# Patient Record
Sex: Male | Born: 1960 | Race: White | Hispanic: No | Marital: Single | Smoking: Current every day smoker
Health system: Southern US, Community
[De-identification: ages and names within clinical notes are randomized; demographics above are authoritative.]

## PROBLEM LIST (undated history)

## (undated) DIAGNOSIS — Z720 Tobacco use: Secondary | ICD-10-CM

## (undated) DIAGNOSIS — F1010 Alcohol abuse, uncomplicated: Secondary | ICD-10-CM

## (undated) DIAGNOSIS — I10 Essential (primary) hypertension: Secondary | ICD-10-CM

## (undated) HISTORY — DX: Essential (primary) hypertension: I10

## (undated) HISTORY — PX: NECK SURGERY: SHX720

## (undated) HISTORY — DX: Tobacco use: Z72.0

## (undated) HISTORY — DX: Alcohol abuse, uncomplicated: F10.10

---

## 1998-06-03 ENCOUNTER — Encounter: Admission: RE | Admit: 1998-06-03 | Discharge: 1998-07-18 | Payer: Self-pay | Admitting: Family Medicine

## 2002-06-14 ENCOUNTER — Encounter: Payer: Self-pay | Admitting: Neurosurgery

## 2002-06-15 ENCOUNTER — Encounter: Payer: Self-pay | Admitting: Neurosurgery

## 2002-06-15 ENCOUNTER — Inpatient Hospital Stay (HOSPITAL_COMMUNITY): Admission: RE | Admit: 2002-06-15 | Discharge: 2002-06-16 | Payer: Self-pay | Admitting: Neurosurgery

## 2003-02-21 ENCOUNTER — Other Ambulatory Visit: Payer: Self-pay

## 2006-05-08 ENCOUNTER — Other Ambulatory Visit: Payer: Self-pay

## 2006-05-09 ENCOUNTER — Inpatient Hospital Stay: Payer: Self-pay | Admitting: Internal Medicine

## 2009-04-09 ENCOUNTER — Emergency Department: Payer: Self-pay | Admitting: Emergency Medicine

## 2010-08-11 ENCOUNTER — Inpatient Hospital Stay: Payer: Self-pay | Admitting: Internal Medicine

## 2010-08-12 DIAGNOSIS — I517 Cardiomegaly: Secondary | ICD-10-CM

## 2010-08-13 DIAGNOSIS — I509 Heart failure, unspecified: Secondary | ICD-10-CM

## 2010-08-13 DIAGNOSIS — I428 Other cardiomyopathies: Secondary | ICD-10-CM

## 2010-08-14 DIAGNOSIS — J984 Other disorders of lung: Secondary | ICD-10-CM

## 2010-09-09 ENCOUNTER — Encounter: Payer: Self-pay | Admitting: *Deleted

## 2010-09-09 ENCOUNTER — Encounter: Payer: Self-pay | Admitting: Cardiovascular Disease

## 2010-09-09 ENCOUNTER — Ambulatory Visit (INDEPENDENT_AMBULATORY_CARE_PROVIDER_SITE_OTHER): Payer: Self-pay | Admitting: Cardiovascular Disease

## 2010-09-09 DIAGNOSIS — I10 Essential (primary) hypertension: Secondary | ICD-10-CM

## 2010-09-09 DIAGNOSIS — I429 Cardiomyopathy, unspecified: Secondary | ICD-10-CM | POA: Insufficient documentation

## 2010-09-09 DIAGNOSIS — I509 Heart failure, unspecified: Secondary | ICD-10-CM | POA: Insufficient documentation

## 2010-09-09 DIAGNOSIS — I428 Other cardiomyopathies: Secondary | ICD-10-CM

## 2010-09-09 DIAGNOSIS — F1010 Alcohol abuse, uncomplicated: Secondary | ICD-10-CM

## 2010-09-09 MED ORDER — LISINOPRIL 40 MG PO TABS: 40.0000 mg | ORAL_TABLET | Freq: Every day | ORAL | Status: DC

## 2010-09-09 MED ORDER — CARVEDILOL 25 MG PO TABS: 25.0000 mg | ORAL_TABLET | Freq: Two times a day (BID) | ORAL | Status: DC

## 2010-09-09 MED ORDER — IPRATROPIUM-ALBUTEROL 18-103 MCG/ACT IN AERO: 2.0000 | INHALATION_SPRAY | Freq: Four times a day (QID) | RESPIRATORY_TRACT | Status: AC | PRN

## 2010-09-09 NOTE — Assessment & Plan Note (Signed)
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 We will continue to Advance his cardiac medications. Will increase his lisinopril to 40 mg daily, increase his coreg.

## 2010-09-09 NOTE — Assessment & Plan Note (Signed)
Blood pressure is poorly controlled. We have made to medication changes as above. Last him to monitor his blood pressure at home.

## 2010-09-09 NOTE — Patient Instructions (Signed)
You are doing well. Please increase your lisinopril to 40 mg daily. Please increase the coreg to 25 mg twice a day. Monitor your blood pressure. Please call us if you have new issues that need to be addressed before your next appt.  We will call you for a follow up Appt. In 3 months

## 2010-09-09 NOTE — Assessment & Plan Note (Signed)
He reports that he has stopped drinking. We will need to monitor him as he would likely have relapses in the future.

## 2010-09-09 NOTE — Progress Notes (Signed)
Patient ID: [name], male    DOB: May 14, 1960, 50 y.o.   MRN: [id number]  HPI Comments: [name] is a 50 year old gentleman with obesity, long alcohol history, long smoking history, COPD, alcoholic hepatitis, who was recently evaluated in the emergency room and admitted to the hospital for weakness and shortness of breath. He was evaluated by Dr. Jens Som for CHF. Notes indicate he drinks 6-824 ounce beers per day. The patient also wanted detoxification in the hospital.   He was started on Heart failure medications, had diuresis. Ejection fraction was noted to be less than 25% with severe global hypokinesis. Unable to estimate right ventricular systolic pressures. Today, he reports that he feels better. His breathing is somewhat better. His edema has improved. He reports that he is not drinking anymore since he left the hospital.  EKG shows normal sinus rhythm with rate 85 beats per minute left axis deviation  Outpatient Encounter Prescriptions as of 09/09/2010  Medication Sig Dispense Refill  . aspirin 325 MG EC tablet Take 325 mg by mouth daily.        . digoxin (LANOXIN) 0.125 MG tablet Take 125 mcg by mouth daily.        . Fluticasone-Salmeterol (ADVAIR DISKUS) 250-50 MCG/DOSE AEPB Inhale 1 puff into the lungs every 12 (twelve) hours.        . furosemide (LASIX) 40 MG tablet Take 40 mg by mouth 2 (two) times daily.        . potassium chloride (KLOR-CON) 10 MEQ CR tablet Take 10 mEq by mouth daily.        Marland Kitchen spironolactone (ALDACTONE) 25 MG tablet Take 25 mg by mouth daily.        .  carvedilol (COREG) 12.5 MG tablet Take 12.5 mg by mouth 2 (two) times daily with a meal.        .  lisinopril (PRINIVIL,ZESTRIL) 20 MG tablet Take 20 mg by mouth daily.        Marland Kitchen albuterol-ipratropium (COMBIVENT) 18-103 MCG/ACT inhaler Inhale 2 puffs into the lungs every 6 (six) hours as needed for wheezing.  1 Inhaler  6    Review of Systems  Constitutional: Negative.   HENT: Negative.   Eyes:  Negative.   Respiratory: Positive for shortness of breath.   Cardiovascular: Negative.   Gastrointestinal: Negative.   Musculoskeletal: Negative.   Skin: Negative.   Neurological: Negative.   Hematological: Negative.   Psychiatric/Behavioral: Negative.   All other systems reviewed and are negative.    BP 162/118  Pulse 85  Ht 5\' 2"  (1.575 m)  Wt 196 lb (88.905 kg)  BMI 35.85 kg/m2  Physical Exam  Nursing note and vitals reviewed. Constitutional: He is oriented to person, place, and time. He appears well-developed and well-nourished.       Obese male in no apparent distress, on oxygen 2 L.  HENT:  Head: Normocephalic.  Nose: Nose normal.  Mouth/Throat: Oropharynx is clear and moist.  Eyes: Conjunctivae are normal. Pupils are equal, round, and reactive to light.  Neck: Normal range of motion. Neck supple. No JVD present.  Cardiovascular: Normal rate, regular rhythm, S1 normal, S2 normal, normal heart sounds and intact distal pulses.  Exam reveals no gallop and no friction rub.   No murmur heard.      Trace pitting edema bilaterally to below the knees  Pulmonary/Chest: Effort normal. No respiratory distress. He has no wheezes. He has no rales. He exhibits no tenderness.  Decreased breath sounds moderately bilaterally throughout  Abdominal: Soft. Bowel sounds are normal. He exhibits no distension. There is no tenderness.  Musculoskeletal: Normal range of motion. He exhibits edema. He exhibits no tenderness.  Lymphadenopathy:    He has no cervical adenopathy.  Neurological: He is alert and oriented to person, place, and time. Coordination normal.  Skin: Skin is warm and dry. No rash noted. No erythema.  Psychiatric: He has a normal mood and affect. His behavior is normal. Judgment and thought content normal.           Assessment and Plan

## 2010-12-10 ENCOUNTER — Encounter: Payer: Self-pay | Admitting: Cardiovascular Disease

## 2010-12-10 ENCOUNTER — Ambulatory Visit (INDEPENDENT_AMBULATORY_CARE_PROVIDER_SITE_OTHER): Payer: Self-pay | Admitting: Cardiovascular Disease

## 2010-12-10 DIAGNOSIS — I428 Other cardiomyopathies: Secondary | ICD-10-CM

## 2010-12-10 DIAGNOSIS — F17200 Nicotine dependence, unspecified, uncomplicated: Secondary | ICD-10-CM

## 2010-12-10 DIAGNOSIS — IMO0001 Reserved for inherently not codable concepts without codable children: Secondary | ICD-10-CM | POA: Insufficient documentation

## 2010-12-10 DIAGNOSIS — F1010 Alcohol abuse, uncomplicated: Secondary | ICD-10-CM

## 2010-12-10 DIAGNOSIS — I10 Essential (primary) hypertension: Secondary | ICD-10-CM

## 2010-12-10 DIAGNOSIS — I509 Heart failure, unspecified: Secondary | ICD-10-CM

## 2010-12-10 NOTE — Assessment & Plan Note (Signed)
Blood pressure is well controlled on today's visit. No changes made to the medications. 

## 2010-12-10 NOTE — Assessment & Plan Note (Signed)
Alcohol cardiomyopathy. Appears reasonably well compensated today though he does continue to drink. We have encouraged alcohol and smoking cessation. Continue his current medication regimen.

## 2010-12-10 NOTE — Progress Notes (Signed)
Patient ID: [name], male    DOB: Aug 25, 1960, 50 y.o.   MRN: [id number]  HPI Comments: [name] is a 50 year old gentleman with obesity, long alcohol history, long smoking history, COPD, alcoholic hepatitis, who was recently evaluated in the emergency room and admitted to the hospital for weakness and shortness of breath. He was evaluated by Dr. Jens Som for CHF. Notes indicate he drinks 6-824 ounce beers per day. The patient also wanted detoxification in the hospital.   He was started on Heart failure medications, had diuresis. Ejection fraction was noted to be less than 25% with severe global hypokinesis. Unable to estimate right ventricular systolic pressures.   Today, he reports that he continues to feel better. His breathing is  Better The he reports still needing oxygen. His edema has improved.He continues to drink several 24 ounce beers a day and smokes.  EKG shows normal sinus rhythm with rate 81 beats per minute left axis deviation  Outpatient Encounter Prescriptions as of 12/10/2010  Medication Sig Dispense Refill  . albuterol-ipratropium (COMBIVENT) 18-103 MCG/ACT inhaler Inhale 2 puffs into the lungs every 6 (six) hours as needed for wheezing.  1 Inhaler  6  . aspirin 325 MG EC tablet Take 325 mg by mouth daily.        . carvedilol (COREG) 25 MG tablet Take 1 tablet (25 mg total) by mouth 2 (two) times daily with a meal.  60 tablet  6  . digoxin (LANOXIN) 0.125 MG tablet Take 125 mcg by mouth daily.        . Fluticasone-Salmeterol (ADVAIR DISKUS) 250-50 MCG/DOSE AEPB Inhale 1 puff into the lungs every 12 (twelve) hours.        . furosemide (LASIX) 40 MG tablet Take 40 mg by mouth 2 (two) times daily.        Marland Kitchen lisinopril (PRINIVIL,ZESTRIL) 40 MG tablet Take 1 tablet (40 mg total) by mouth daily.  30 tablet  6  . potassium chloride (KLOR-CON) 10 MEQ CR tablet Take 10 mEq by mouth daily.        Marland Kitchen spironolactone (ALDACTONE) 25 MG tablet Take 25 mg by mouth daily.            Review of Systems  Constitutional: Negative.   HENT: Negative.   Eyes: Negative.   Respiratory: Positive for shortness of breath.   Cardiovascular: Negative.   Gastrointestinal: Negative.   Musculoskeletal: Negative.   Skin: Negative.   Neurological: Negative.   Hematological: Negative.   Psychiatric/Behavioral: Negative.   All other systems reviewed and are negative.    BP 100/64  Pulse 86  Ht 5\' 2"  (1.575 m)  Wt 193 lb (87.544 kg)  BMI 35.30 kg/m2  Physical Exam  Nursing note and vitals reviewed. Constitutional: He is oriented to person, place, and time. He appears well-developed and well-nourished.       Obese male in no apparent distress, on oxygen 2 L.  HENT:  Head: Normocephalic.  Nose: Nose normal.  Mouth/Throat: Oropharynx is clear and moist.  Eyes: Conjunctivae are normal. Pupils are equal, round, and reactive to light.  Neck: Normal range of motion. Neck supple. No JVD present.  Cardiovascular: Normal rate, regular rhythm, S1 normal, S2 normal, normal heart sounds and intact distal pulses.  Exam reveals no gallop and no friction rub.   No murmur heard. Pulmonary/Chest: Effort normal and breath sounds normal. No respiratory distress. He has no wheezes. He has no rales. He exhibits no tenderness.  Mildly decreased b/l throughout  Abdominal: Soft. Bowel sounds are normal. He exhibits no distension. There is no tenderness.  Musculoskeletal: Normal range of motion. He exhibits no edema and no tenderness.  Lymphadenopathy:    He has no cervical adenopathy.  Neurological: He is alert and oriented to person, place, and time. Coordination normal.  Skin: Skin is warm and dry. No rash noted. No erythema.  Psychiatric: He has a normal mood and affect. His behavior is normal. Judgment and thought content normal.           Assessment and Plan

## 2010-12-10 NOTE — Assessment & Plan Note (Signed)
Continues to drink several beers per day. We have encouraged alcohol cessation.

## 2010-12-10 NOTE — Assessment & Plan Note (Signed)
We'll encourage smoking cessation.

## 2010-12-10 NOTE — Patient Instructions (Signed)
You are doing well. No medication changes were made.  Please call us if you have new issues that need to be addressed before your next appt.  The office will contact you for a follow up Appt. In 6 months  

## 2010-12-10 NOTE — Assessment & Plan Note (Signed)
Continue current medication regimen. Weight is down 3 pounds from last visit. Goal weight is in the mid to low 190 range.

## 2011-01-28 ENCOUNTER — Inpatient Hospital Stay: Payer: Self-pay | Admitting: Internal Medicine

## 2011-01-29 DIAGNOSIS — I509 Heart failure, unspecified: Secondary | ICD-10-CM

## 2011-02-23 ENCOUNTER — Telehealth: Payer: Self-pay

## 2011-02-23 NOTE — Telephone Encounter (Signed)
Notified Darl Pikes of update medications from the [facility] discharge per Dr. Mariah Milling. The patient will make a follow up with Dr. Mariah Milling in two weeks.

## 2011-02-23 NOTE — Telephone Encounter (Signed)
Would like to know what medications he is to take? The discharge summary did not mention any heart medications.  Please advise.

## 2011-02-25 ENCOUNTER — Telehealth: Payer: Self-pay | Admitting: Cardiovascular Disease

## 2011-02-25 NOTE — Telephone Encounter (Signed)
Returning your call. °

## 2011-02-25 NOTE — Telephone Encounter (Signed)
Jasmine December, did you call pt?

## 2011-03-04 ENCOUNTER — Ambulatory Visit: Payer: Self-pay | Admitting: Gastroenterology

## 2011-03-04 LAB — CBC WITH DIFFERENTIAL/PLATELET
Basophil #: 0.1 10*3/uL (ref 0.0–0.1)
Basophil %: 0.7 %
HCT: 42.7 % (ref 40.0–52.0)
HGB: 14.1 g/dL (ref 13.0–18.0)
MCH: 33.7 pg (ref 26.0–34.0)
MCHC: 33.2 g/dL (ref 32.0–36.0)
MCV: 102 fL — ABNORMAL HIGH (ref 80–100)
Monocyte #: 0.9 10*3/uL — ABNORMAL HIGH (ref 0.0–0.7)
Monocyte %: 9.1 %
Neutrophil %: 64.6 %
WBC: 10 10*3/uL (ref 3.8–10.6)

## 2011-03-11 ENCOUNTER — Ambulatory Visit: Payer: Self-pay | Admitting: Cardiovascular Disease

## 2011-03-23 ENCOUNTER — Encounter: Payer: Self-pay | Admitting: *Deleted

## 2011-03-26 ENCOUNTER — Ambulatory Visit: Payer: Self-pay | Admitting: Cardiovascular Disease

## 2011-04-20 ENCOUNTER — Ambulatory Visit (INDEPENDENT_AMBULATORY_CARE_PROVIDER_SITE_OTHER): Payer: Medicaid Other | Admitting: Cardiovascular Disease

## 2011-04-20 ENCOUNTER — Encounter: Payer: Self-pay | Admitting: Cardiovascular Disease

## 2011-04-20 VITALS — BP 128/76 | HR 87 | Ht 62.0 in | Wt 188.8 lb

## 2011-04-20 DIAGNOSIS — F1010 Alcohol abuse, uncomplicated: Secondary | ICD-10-CM

## 2011-04-20 DIAGNOSIS — I509 Heart failure, unspecified: Secondary | ICD-10-CM

## 2011-04-20 DIAGNOSIS — F17200 Nicotine dependence, unspecified, uncomplicated: Secondary | ICD-10-CM

## 2011-04-20 DIAGNOSIS — R17 Unspecified jaundice: Secondary | ICD-10-CM

## 2011-04-20 DIAGNOSIS — I428 Other cardiomyopathies: Secondary | ICD-10-CM

## 2011-04-20 DIAGNOSIS — I10 Essential (primary) hypertension: Secondary | ICD-10-CM

## 2011-04-20 NOTE — Assessment & Plan Note (Signed)
We have encouraged abstinence.

## 2011-04-20 NOTE — Assessment & Plan Note (Addendum)
He is markedly jaundiced on today's visit. We will refer him to Dr. Bluford Kaufmann, who he has seen in the past for EGD and colonoscopy. 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 We have recommended he start lactulose for goal 4-5 bowel movements per day.

## 2011-04-20 NOTE — Patient Instructions (Addendum)
Please go to the pharmacy and buy lactulose Take lactulose three times a day. Goal is four bowel movements a day  Please call us if you have new issues that need to be addressed before your next appt.  Your physician wants you to follow-up in: 6 months.  You will receive a reminder letter in the mail two months in advance. If you don't receive a letter, please call our office to schedule the follow-up appointment.  YOU HAVE AN APPOINTMENT 04/21/11 @ 11 AM @ DR. Nigel Bridgeman OFFICE WITH THE NURSE PRACTIONER, PLEASE BRING ALL OF YOUR MEDICATION/LIST WITH YOU.

## 2011-04-20 NOTE — Assessment & Plan Note (Signed)
Blood pressure is well controlled on today's visit. No changes made to the medications. 

## 2011-04-20 NOTE — Progress Notes (Signed)
Patient ID: [name], male    DOB: 1960-09-14, 51 y.o.   MRN: [id number]  HPI Comments: [name] is a 51 year old gentleman with obesity, long alcohol history, long smoking history, COPD, alcoholic hepatitis, who was evaluated in the emergency room and admitted to the hospital for weakness and shortness of breath, evaluated for ETOH cardiomyopathy and CHF. Notes indicate [name] drinks 6-8  24 ounce beers per day. The patient also wanted detoxification in the hospital.   [name] was started on Heart failure medications in the hospital with good diuresis and improvement of his symptoms. Echo showed  Ejection fraction  less than 25% with severe global hypokinesis.  [name] continues to drink several 24 ounce beers a day and smokes. Weight is stable, possibly down several pounds from his last clinic visit. [name] has noticed jaundice over the past several weeks. No itching. No significant confusion. [name] reports last seeing Dr. Mayford Knife, PCP, one month ago. [name] is taking Lasix daily. No significant abdominal swelling. No nausea vomiting.  EKG shows normal sinus rhythm with rate 87 beats per minute left axis deviation  Outpatient Encounter Prescriptions as of 04/20/2011  Medication Sig Dispense Refill  . albuterol-ipratropium (COMBIVENT) 18-103 MCG/ACT inhaler Inhale 2 puffs into the lungs every 6 (six) hours as needed for wheezing.  1 Inhaler  6  . aspirin 325 MG EC tablet Take 325 mg by mouth daily.        . carvedilol (COREG) 25 MG tablet Take 12.5 mg by mouth 2 (two) times daily with a meal.      . Fluticasone-Salmeterol (ADVAIR DISKUS) 250-50 MCG/DOSE AEPB Inhale 1 puff into the lungs every 12 (twelve) hours.        . furosemide (LASIX) 20 MG tablet Take 20 mg by mouth daily.      Marland Kitchen lisinopril (PRINIVIL,ZESTRIL) 5 MG tablet Take 5 mg by mouth daily.      . vitamin B-12 (CYANOCOBALAMIN) 1000 MCG tablet Take 1,000 mcg by mouth daily.      Marland Kitchen lisinopril (PRINIVIL,ZESTRIL) 40 MG tablet Take 5 mg by  mouth daily.        Review of Systems  Constitutional: Negative.        Jaundice  HENT: Negative.   Eyes: Negative.   Cardiovascular: Negative.   Gastrointestinal: Negative.   Musculoskeletal: Negative.   Skin: Negative.   Neurological: Negative.   Hematological: Negative.   Psychiatric/Behavioral: Negative.   All other systems reviewed and are negative.    BP 128/76  Pulse 87  Ht 5\' 2"  (1.575 m)  Wt 188 lb 12.8 oz (85.639 kg)  BMI 34.53 kg/m2  Physical Exam  Nursing note and vitals reviewed. Constitutional: [name] is oriented to person, place, and time. [name] appears well-developed and well-nourished.       Obese male in no apparent distress, on oxygen 2 L. [name] appears very jaundiced compared to previous visits. No asterixis on exam.   HENT:  Head: Normocephalic.  Nose: Nose normal.  Mouth/Throat: Oropharynx is clear and moist.  Eyes: Conjunctivae are normal. Pupils are equal, round, and reactive to light.  Neck: Normal range of motion. Neck supple. No JVD present.  Cardiovascular: Normal rate, regular rhythm, S1 normal, S2 normal, normal heart sounds and intact distal pulses.  Exam reveals no gallop and no friction rub.   No murmur heard. Pulmonary/Chest: Effort normal and breath sounds normal. No respiratory distress. [name] has no wheezes. [name] has no rales. [name] exhibits no tenderness.  Mildly decreased b/l throughout  Abdominal: Soft. Bowel sounds are normal. [name] exhibits no distension. There is no tenderness.  Musculoskeletal: Normal range of motion. [name] exhibits no edema and no tenderness.  Lymphadenopathy:    [name] has no cervical adenopathy.  Neurological: [name] is alert and oriented to person, place, and time. Coordination normal.  Skin: Skin is warm and dry. No rash noted. No erythema.  Psychiatric: [name] has a normal mood and affect. His behavior is normal. Judgment and thought content normal.           Assessment and Plan

## 2011-04-20 NOTE — Assessment & Plan Note (Signed)
Long smoking history, COPD, on oxygen. We have encouraged him to continue to work on weaning his cigarettes and smoking cessation. He will continue to work on this and does not want any assistance with chantix.

## 2011-04-20 NOTE — Assessment & Plan Note (Signed)
Alcohol cardiomyopathy. He has been more stable on the diuretic and CHF medications despite the fact that he continues to drink. I believe that he is drinking less but continues to drink beer.

## 2011-04-21 ENCOUNTER — Ambulatory Visit: Payer: Self-pay | Admitting: Family

## 2011-04-22 ENCOUNTER — Ambulatory Visit: Payer: Self-pay | Admitting: Gastroenterology

## 2011-05-19 ENCOUNTER — Ambulatory Visit: Payer: Self-pay | Admitting: Gastroenterology

## 2011-05-19 LAB — PROTIME-INR: Prothrombin Time: 14.7 secs (ref 11.5–14.7)

## 2011-06-11 ENCOUNTER — Ambulatory Visit: Payer: Self-pay | Admitting: Gastroenterology

## 2011-06-23 ENCOUNTER — Ambulatory Visit: Payer: Self-pay | Admitting: Gastroenterology

## 2011-07-29 ENCOUNTER — Telehealth: Payer: Self-pay | Admitting: Cardiovascular Disease

## 2011-07-29 NOTE — Telephone Encounter (Signed)
Pt's partner says he found both parents deceased 08-05-22.  Since this occurrence the pt and himself have been very upset. His partner began to cry, saying "hes not taking it too well". He says "he just doesn't want to move".  He says pt's face is very edematous and he coughs when he lies flat.  He denies worsening LE edema or abd distension worse than usual.  He confirms medication compliance.  BP is 95/72.  He takes lasix 20 mg daily.  I explained it may be beneficial to pt to take an extra lasix today and see if this helps symptoms, but BP is on the low side so I am going to discuss with Dr. Mariah Milling and call pt back.  Understanding verb.

## 2011-07-29 NOTE — Telephone Encounter (Signed)
Pt partner called stating that pt is having a lot of increased swelling. Wants to talk to a nurse

## 2011-07-29 NOTE — Telephone Encounter (Signed)
I called pt and spoke with partner. Partner says pt has not been drinking ETOH more than usual. Partnr mentions pt is "yellow" and has been this way since 6/8. I advised him to f/u with PCP/GI ASAP. Partner says he has made him an appt with both but he doesn't recall when these are. He says he has appts written down somewhere.  I explained the importance of having pt take an extra lasix today and tomm. And see if this helps his symptoms.  In the meantime, if he feels pt is more yellow than usual he should go to ER. Partner says he is always yellow. Unclear on history. I offered partner an appt for pt 7/5(Dr. Gollan's first avail). He began to cry over phone saying we "cannot let him die". I explained we are trying to help pt feel better and one way to do this is to have him take extra diuretic today and tomm.  In the meantime, I will check with our manager to see if we can see pt any sooner than 7/5 and will let him know.

## 2011-07-29 NOTE — Telephone Encounter (Signed)
Discussed with Dr. Mariah Milling who says to make sure pt has not been binge drinking/drinking more ETOH than usual.  If he has been bingeing he needs to go to hosp for detox/thiamine, etc.  If not, ok to take extra lasix over the next few days to see if this helps symptoms.  I will call partner to advise.

## 2011-08-04 NOTE — Telephone Encounter (Signed)
LMTCB

## 2011-11-20 ENCOUNTER — Inpatient Hospital Stay: Payer: Self-pay | Admitting: Internal Medicine

## 2011-11-20 LAB — CBC
HCT: 27.6 % — ABNORMAL LOW (ref 40.0–52.0)
HGB: 9.7 g/dL — ABNORMAL LOW (ref 13.0–18.0)
MCV: 98 fL (ref 80–100)
Platelet: 253 10*3/uL (ref 150–440)
RBC: 2.82 10*6/uL — ABNORMAL LOW (ref 4.40–5.90)
WBC: 19.2 10*3/uL — ABNORMAL HIGH (ref 3.8–10.6)

## 2011-11-20 LAB — COMPREHENSIVE METABOLIC PANEL
Alkaline Phosphatase: 377 U/L — ABNORMAL HIGH (ref 50–136)
BUN: 18 mg/dL (ref 7–18)
Bilirubin,Total: 18.3 mg/dL — ABNORMAL HIGH (ref 0.2–1.0)
Chloride: 89 mmol/L — ABNORMAL LOW (ref 98–107)
Creatinine: 0.55 mg/dL — ABNORMAL LOW (ref 0.60–1.30)
SGPT (ALT): 202 U/L — ABNORMAL HIGH (ref 12–78)
Sodium: 127 mmol/L — ABNORMAL LOW (ref 136–145)
Total Protein: 6.6 g/dL (ref 6.4–8.2)

## 2011-11-20 LAB — PROTIME-INR
INR: 1.4
Prothrombin Time: 17.3 secs — ABNORMAL HIGH (ref 11.5–14.7)

## 2011-11-20 LAB — URINALYSIS, COMPLETE
Bacteria: NONE SEEN
Blood: NEGATIVE
Glucose,UR: NEGATIVE mg/dL (ref 0–75)
Leukocyte Esterase: NEGATIVE
Nitrite: NEGATIVE
Ph: 6 (ref 4.5–8.0)
Protein: 30
Squamous Epithelial: 1

## 2011-11-21 LAB — COMPREHENSIVE METABOLIC PANEL
Albumin: 2.1 g/dL — ABNORMAL LOW (ref 3.4–5.0)
Alkaline Phosphatase: 313 U/L — ABNORMAL HIGH (ref 50–136)
Anion Gap: 6 — ABNORMAL LOW (ref 7–16)
Bilirubin,Total: 16.4 mg/dL — ABNORMAL HIGH (ref 0.2–1.0)
Calcium, Total: 7.9 mg/dL — ABNORMAL LOW (ref 8.5–10.1)
Chloride: 98 mmol/L (ref 98–107)
Creatinine: 0.64 mg/dL (ref 0.60–1.30)
EGFR (Non-African Amer.): >60
Glucose: 91 mg/dL (ref 65–99)
Osmolality: 271 (ref 275–301)
SGPT (ALT): 179 U/L — ABNORMAL HIGH (ref 12–78)
Sodium: 135 mmol/L — ABNORMAL LOW (ref 136–145)

## 2011-11-21 LAB — CBC WITH DIFFERENTIAL/PLATELET
Bands: 2 %
Basophil #: 0.1 10*3/uL (ref 0.0–0.1)
Eosinophil %: 1 %
Eosinophil: 1 %
HCT: 25.6 % — ABNORMAL LOW (ref 40.0–52.0)
HGB: 7.9 g/dL — ABNORMAL LOW (ref 13.0–18.0)
HGB: 8.8 g/dL — ABNORMAL LOW (ref 13.0–18.0)
MCH: 33.7 pg (ref 26.0–34.0)
MCHC: 35.6 g/dL (ref 32.0–36.0)
MCHC: 34.3 g/dL (ref 32.0–36.0)
Monocyte #: 1.2 x10 3/mm — ABNORMAL HIGH (ref 0.2–1.0)
Monocyte %: 9.7 %
Monocytes: 4 %
Myelocyte: 3 %
Neutrophil #: 8.9 10*3/uL — ABNORMAL HIGH (ref 1.4–6.5)
Neutrophil %: 71.5 %
Platelet: 139 10*3/uL — ABNORMAL LOW (ref 150–440)
RBC: 2.23 10*6/uL — ABNORMAL LOW (ref 4.40–5.90)
RBC: 2.61 10*6/uL — ABNORMAL LOW (ref 4.40–5.90)
Segmented Neutrophils: 82 %

## 2011-11-21 LAB — PROTIME-INR
INR: 1.4
Prothrombin Time: 17.3 secs — ABNORMAL HIGH (ref 11.5–14.7)

## 2011-11-21 LAB — BASIC METABOLIC PANEL
Anion Gap: 6 — ABNORMAL LOW (ref 7–16)
BUN: 13 mg/dL (ref 7–18)
Co2: 29 mmol/L (ref 21–32)
EGFR (African American): >60
EGFR (Non-African Amer.): >60
Glucose: 109 mg/dL — ABNORMAL HIGH (ref 65–99)
Osmolality: 275 (ref 275–301)
Potassium: 4.6 mmol/L (ref 3.5–5.1)
Sodium: 137 mmol/L (ref 136–145)

## 2011-11-21 LAB — PRO B NATRIURETIC PEPTIDE: B-Type Natriuretic Peptide: 314 pg/mL — ABNORMAL HIGH (ref 0–125)

## 2011-11-22 DIAGNOSIS — I059 Rheumatic mitral valve disease, unspecified: Secondary | ICD-10-CM

## 2011-11-22 LAB — CBC WITH DIFFERENTIAL/PLATELET
Basophil #: 0 10*3/uL (ref 0.0–0.1)
Basophil #: 0.1 10*3/uL (ref 0.0–0.1)
Basophil %: 1.3 %
Eosinophil #: 0.1 10*3/uL (ref 0.0–0.7)
Eosinophil %: 1.5 %
Eosinophil %: 1.7 %
HCT: 23.8 % — ABNORMAL LOW (ref 40.0–52.0)
HCT: 24.8 % — ABNORMAL LOW (ref 40.0–52.0)
HGB: 7.6 g/dL — ABNORMAL LOW (ref 13.0–18.0)
HGB: 8.8 g/dL — ABNORMAL LOW (ref 13.0–18.0)
Lymphocyte #: 2.1 10*3/uL (ref 1.0–3.6)
Lymphocyte %: 15.8 %
Lymphocyte %: 19.8 %
MCH: 34.5 pg — ABNORMAL HIGH (ref 26.0–34.0)
MCHC: 35 g/dL (ref 32.0–36.0)
MCHC: 35 g/dL (ref 32.0–36.0)
MCV: 98 fL (ref 80–100)
MCV: 99 fL (ref 80–100)
Monocyte #: 0.9 x10 3/mm (ref 0.2–1.0)
Monocyte %: 9.7 %
Monocyte %: 8.3 %
Monocyte %: 8.5 %
Neutrophil #: 9.8 10*3/uL — ABNORMAL HIGH (ref 1.4–6.5)
Neutrophil #: 7.3 10*3/uL — ABNORMAL HIGH (ref 1.4–6.5)
Neutrophil %: 73.6 %
Neutrophil %: 71.9 %
Platelet: 136 10*3/uL — ABNORMAL LOW (ref 150–440)
Platelet: 132 10*3/uL — ABNORMAL LOW (ref 150–440)
RBC: 2.43 10*6/uL — ABNORMAL LOW (ref 4.40–5.90)
RBC: 2.19 10*6/uL — ABNORMAL LOW (ref 4.40–5.90)
RBC: 2.5 10*6/uL — ABNORMAL LOW (ref 4.40–5.90)
RDW: 18.6 % — ABNORMAL HIGH (ref 11.5–14.5)
RDW: 18.2 % — ABNORMAL HIGH (ref 11.5–14.5)
WBC: 13.4 10*3/uL — ABNORMAL HIGH (ref 3.8–10.6)
WBC: 11 10*3/uL — ABNORMAL HIGH (ref 3.8–10.6)
WBC: 10.6 10*3/uL (ref 3.8–10.6)

## 2011-11-22 LAB — COMPREHENSIVE METABOLIC PANEL
Calcium, Total: 7.8 mg/dL — ABNORMAL LOW (ref 8.5–10.1)
Chloride: 101 mmol/L (ref 98–107)
Co2: 34 mmol/L — ABNORMAL HIGH (ref 21–32)
Creatinine: 0.76 mg/dL (ref 0.60–1.30)
EGFR (Non-African Amer.): >60
Glucose: 82 mg/dL (ref 65–99)
Potassium: 3.7 mmol/L (ref 3.5–5.1)
SGOT(AST): 311 U/L — ABNORMAL HIGH (ref 15–37)

## 2011-11-23 LAB — CBC WITH DIFFERENTIAL/PLATELET
Basophil #: 0.1 10*3/uL (ref 0.0–0.1)
Basophil #: 0.1 10*3/uL (ref 0.0–0.1)
Basophil #: 0.1 10*3/uL (ref 0.0–0.1)
Basophil %: 0.7 %
Basophil %: 1 %
Eosinophil #: 0.2 10*3/uL (ref 0.0–0.7)
Eosinophil %: 0.7 %
HCT: 24.1 % — ABNORMAL LOW (ref 40.0–52.0)
HCT: 24.6 % — ABNORMAL LOW (ref 40.0–52.0)
HGB: 8.4 g/dL — ABNORMAL LOW (ref 13.0–18.0)
HGB: 9.2 g/dL — ABNORMAL LOW (ref 13.0–18.0)
Lymphocyte #: 1.6 10*3/uL (ref 1.0–3.6)
Lymphocyte #: 0.9 10*3/uL — ABNORMAL LOW (ref 1.0–3.6)
Lymphocyte %: 11 %
Lymphocyte %: 18.2 %
Lymphocyte %: 18.7 %
MCH: 34.7 pg — ABNORMAL HIGH (ref 26.0–34.0)
MCH: 35 pg — ABNORMAL HIGH (ref 26.0–34.0)
MCHC: 35 g/dL (ref 32.0–36.0)
MCHC: 35.3 g/dL (ref 32.0–36.0)
MCV: 102 fL — ABNORMAL HIGH (ref 80–100)
MCV: 99 fL (ref 80–100)
Monocyte #: 0.9 x10 3/mm (ref 0.2–1.0)
Monocyte %: 7 %
Monocyte %: 4.4 %
Neutrophil #: 6.3 10*3/uL (ref 1.4–6.5)
Neutrophil #: 7 10*3/uL — ABNORMAL HIGH (ref 1.4–6.5)
Neutrophil #: 7.2 10*3/uL — ABNORMAL HIGH (ref 1.4–6.5)
Neutrophil %: 69.3 %
Neutrophil %: 83.2 %
Platelet: 119 10*3/uL — ABNORMAL LOW (ref 150–440)
Platelet: 125 10*3/uL — ABNORMAL LOW (ref 150–440)
RBC: 2.41 10*6/uL — ABNORMAL LOW (ref 4.40–5.90)
RBC: 2.63 10*6/uL — ABNORMAL LOW (ref 4.40–5.90)
RDW: 18.7 % — ABNORMAL HIGH (ref 11.5–14.5)
RDW: 19.2 % — ABNORMAL HIGH (ref 11.5–14.5)
WBC: 10.9 10*3/uL — ABNORMAL HIGH (ref 3.8–10.6)
WBC: 8.4 10*3/uL (ref 3.8–10.6)
WBC: 8.8 10*3/uL (ref 3.8–10.6)

## 2011-11-23 LAB — COMPREHENSIVE METABOLIC PANEL
Albumin: 2 g/dL — ABNORMAL LOW (ref 3.4–5.0)
Alkaline Phosphatase: 252 U/L — ABNORMAL HIGH (ref 50–136)
Anion Gap: 4 — ABNORMAL LOW (ref 7–16)
Anion Gap: 6 — ABNORMAL LOW (ref 7–16)
BUN: 9 mg/dL (ref 7–18)
Calcium, Total: 8.4 mg/dL — ABNORMAL LOW (ref 8.5–10.1)
Chloride: 98 mmol/L (ref 98–107)
Co2: 36 mmol/L — ABNORMAL HIGH (ref 21–32)
EGFR (African American): >60
EGFR (Non-African Amer.): >60
Glucose: 121 mg/dL — ABNORMAL HIGH (ref 65–99)
Glucose: 95 mg/dL (ref 65–99)
Osmolality: 274 (ref 275–301)
Osmolality: 276 (ref 275–301)
Potassium: 3.3 mmol/L — ABNORMAL LOW (ref 3.5–5.1)
Potassium: 3.4 mmol/L — ABNORMAL LOW (ref 3.5–5.1)
SGOT(AST): 245 U/L — ABNORMAL HIGH (ref 15–37)
SGOT(AST): 253 U/L — ABNORMAL HIGH (ref 15–37)
Sodium: 138 mmol/L (ref 136–145)
Sodium: 138 mmol/L (ref 136–145)
Total Protein: 5.8 g/dL — ABNORMAL LOW (ref 6.4–8.2)

## 2011-11-24 LAB — CBC WITH DIFFERENTIAL/PLATELET
Basophil %: 0.5 %
Eosinophil #: 0.1 10*3/uL (ref 0.0–0.7)
Eosinophil %: 0.7 %
Lymphocyte #: 1.5 10*3/uL (ref 1.0–3.6)
MCH: 34.5 pg — ABNORMAL HIGH (ref 26.0–34.0)
MCHC: 33.9 g/dL (ref 32.0–36.0)
MCV: 102 fL — ABNORMAL HIGH (ref 80–100)
Monocyte #: 0.8 x10 3/mm (ref 0.2–1.0)
Platelet: 122 10*3/uL — ABNORMAL LOW (ref 150–440)
RBC: 2.51 10*6/uL — ABNORMAL LOW (ref 4.40–5.90)
RDW: 18.9 % — ABNORMAL HIGH (ref 11.5–14.5)

## 2011-11-25 LAB — CBC WITH DIFFERENTIAL/PLATELET
Eosinophil #: 0.1 10*3/uL (ref 0.0–0.7)
Eosinophil %: 0.7 %
HGB: 8.5 g/dL — ABNORMAL LOW (ref 13.0–18.0)
MCH: 35.9 pg — ABNORMAL HIGH (ref 26.0–34.0)
Monocyte #: 0.8 x10 3/mm (ref 0.2–1.0)
Monocyte %: 8.1 %
Neutrophil %: 74.3 %
Platelet: 126 10*3/uL — ABNORMAL LOW (ref 150–440)
WBC: 9.4 10*3/uL (ref 3.8–10.6)

## 2011-11-25 LAB — COMPREHENSIVE METABOLIC PANEL
Albumin: 2.1 g/dL — ABNORMAL LOW (ref 3.4–5.0)
Alkaline Phosphatase: 224 U/L — ABNORMAL HIGH (ref 50–136)
Anion Gap: 5 — ABNORMAL LOW (ref 7–16)
Bilirubin,Total: 5.5 mg/dL — ABNORMAL HIGH (ref 0.2–1.0)
Calcium, Total: 8.5 mg/dL (ref 8.5–10.1)
Co2: 36 mmol/L — ABNORMAL HIGH (ref 21–32)
Creatinine: 0.71 mg/dL (ref 0.60–1.30)
Glucose: 80 mg/dL (ref 65–99)
Osmolality: 278 (ref 275–301)
Potassium: 3.9 mmol/L (ref 3.5–5.1)
Sodium: 141 mmol/L (ref 136–145)
Total Protein: 6.2 g/dL — ABNORMAL LOW (ref 6.4–8.2)

## 2011-11-25 LAB — AMMONIA: Ammonia, Plasma: 100 mcmol/L — ABNORMAL HIGH (ref 11–32)

## 2011-11-26 LAB — BASIC METABOLIC PANEL
Anion Gap: 5 — ABNORMAL LOW (ref 7–16)
Calcium, Total: 8.7 mg/dL (ref 8.5–10.1)
Chloride: 102 mmol/L (ref 98–107)
Co2: 34 mmol/L — ABNORMAL HIGH (ref 21–32)
Creatinine: 0.75 mg/dL (ref 0.60–1.30)
EGFR (African American): >60
Glucose: 87 mg/dL (ref 65–99)
Sodium: 141 mmol/L (ref 136–145)

## 2011-11-26 LAB — MAGNESIUM: Magnesium: 1.7 mg/dL — ABNORMAL LOW

## 2011-11-26 LAB — CULTURE, BLOOD (SINGLE)

## 2011-11-27 LAB — CBC WITH DIFFERENTIAL/PLATELET
HCT: 24.2 % — ABNORMAL LOW (ref 40.0–52.0)
Lymphocytes: 25 %
MCHC: 34.2 g/dL (ref 32.0–36.0)
MCV: 103 fL — ABNORMAL HIGH (ref 80–100)
Metamyelocyte: 1 %
Myelocyte: 1 %
Platelet: 148 10*3/uL — ABNORMAL LOW (ref 150–440)
RDW: 23.9 % — ABNORMAL HIGH (ref 11.5–14.5)

## 2011-11-27 LAB — COMPREHENSIVE METABOLIC PANEL
Albumin: 2.2 g/dL — ABNORMAL LOW (ref 3.4–5.0)
Bilirubin,Total: 3.9 mg/dL — ABNORMAL HIGH (ref 0.2–1.0)
Chloride: 99 mmol/L (ref 98–107)
Co2: 35 mmol/L — ABNORMAL HIGH (ref 21–32)
Creatinine: 0.6 mg/dL (ref 0.60–1.30)
EGFR (African American): >60
EGFR (Non-African Amer.): >60
Glucose: 75 mg/dL (ref 65–99)
SGPT (ALT): 107 U/L — ABNORMAL HIGH (ref 12–78)
Total Protein: 6 g/dL — ABNORMAL LOW (ref 6.4–8.2)

## 2011-12-10 ENCOUNTER — Inpatient Hospital Stay: Payer: Self-pay | Admitting: Internal Medicine

## 2011-12-10 LAB — HEMOGLOBIN: HGB: 7.7 g/dL — ABNORMAL LOW (ref 13.0–18.0)

## 2011-12-10 LAB — CBC WITH DIFFERENTIAL/PLATELET
Basophil %: 0.9 %
Eosinophil #: 0 10*3/uL (ref 0.0–0.7)
Eosinophil %: 0.5 %
HCT: 21.7 % — ABNORMAL LOW (ref 40.0–52.0)
HGB: 7.1 g/dL — ABNORMAL LOW (ref 13.0–18.0)
MCV: 107 fL — ABNORMAL HIGH (ref 80–100)
Monocyte %: 4.3 %
Neutrophil #: 5.3 10*3/uL (ref 1.4–6.5)
RBC: 2.03 10*6/uL — ABNORMAL LOW (ref 4.40–5.90)
RDW: 20.5 % — ABNORMAL HIGH (ref 11.5–14.5)
WBC: 6.9 10*3/uL (ref 3.8–10.6)

## 2011-12-10 LAB — HEMOGLOBIN A1C: Hemoglobin A1C: 4.1 % — ABNORMAL LOW (ref 4.2–6.3)

## 2011-12-10 LAB — COMPREHENSIVE METABOLIC PANEL
Alkaline Phosphatase: 89 U/L (ref 50–136)
Anion Gap: 9 (ref 7–16)
Bilirubin,Total: 1.1 mg/dL — ABNORMAL HIGH (ref 0.2–1.0)
Calcium, Total: 8.4 mg/dL — ABNORMAL LOW (ref 8.5–10.1)
Chloride: 103 mmol/L (ref 98–107)
Co2: 31 mmol/L (ref 21–32)
Creatinine: 0.92 mg/dL (ref 0.60–1.30)
Osmolality: 291 (ref 275–301)
Potassium: 4.3 mmol/L (ref 3.5–5.1)
SGPT (ALT): 27 U/L (ref 12–78)
Sodium: 143 mmol/L (ref 136–145)
Total Protein: 6.2 g/dL — ABNORMAL LOW (ref 6.4–8.2)

## 2011-12-10 LAB — FOLATE: Folic Acid: 18.2 ng/mL (ref 3.1–100.0)

## 2011-12-10 LAB — APTT: Activated PTT: 30.2 secs (ref 23.6–35.9)

## 2011-12-11 LAB — CBC WITH DIFFERENTIAL/PLATELET
Basophil #: 0 10*3/uL (ref 0.0–0.1)
Basophil %: 0.4 %
Eosinophil #: 0 10*3/uL (ref 0.0–0.7)
HGB: 7.7 g/dL — ABNORMAL LOW (ref 13.0–18.0)
Lymphocyte #: 0.9 10*3/uL — ABNORMAL LOW (ref 1.0–3.6)
Lymphocyte %: 15.3 %
MCH: 34.8 pg — ABNORMAL HIGH (ref 26.0–34.0)
MCHC: 32.6 g/dL (ref 32.0–36.0)
MCV: 107 fL — ABNORMAL HIGH (ref 80–100)
Monocyte #: 0.1 x10 3/mm — ABNORMAL LOW (ref 0.2–1.0)
Neutrophil #: 4.7 10*3/uL (ref 1.4–6.5)
Neutrophil %: 82.8 %
RBC: 2.21 10*6/uL — ABNORMAL LOW (ref 4.40–5.90)
RDW: 19.8 % — ABNORMAL HIGH (ref 11.5–14.5)

## 2011-12-11 LAB — COMPREHENSIVE METABOLIC PANEL
Albumin: 2.7 g/dL — ABNORMAL LOW (ref 3.4–5.0)
Anion Gap: 6 — ABNORMAL LOW (ref 7–16)
BUN: 8 mg/dL (ref 7–18)
Bilirubin,Total: 1.1 mg/dL — ABNORMAL HIGH (ref 0.2–1.0)
Creatinine: 0.64 mg/dL (ref 0.60–1.30)
Glucose: 154 mg/dL — ABNORMAL HIGH (ref 65–99)
SGOT(AST): 17 U/L (ref 15–37)
SGPT (ALT): 25 U/L (ref 12–78)
Total Protein: 6.8 g/dL (ref 6.4–8.2)

## 2011-12-11 LAB — BASIC METABOLIC PANEL
BUN: 8 mg/dL (ref 7–18)
Calcium, Total: 8.9 mg/dL (ref 8.5–10.1)
Chloride: 100 mmol/L (ref 98–107)
Co2: 35 mmol/L — ABNORMAL HIGH (ref 21–32)
Creatinine: 0.7 mg/dL (ref 0.60–1.30)
Osmolality: 279 (ref 275–301)
Potassium: 4.4 mmol/L (ref 3.5–5.1)
Sodium: 139 mmol/L (ref 136–145)

## 2011-12-12 LAB — CBC WITH DIFFERENTIAL/PLATELET
Basophil #: 0 10*3/uL (ref 0.0–0.1)
Basophil %: 0.2 %
HCT: 23 % — ABNORMAL LOW (ref 40.0–52.0)
HGB: 7.6 g/dL — ABNORMAL LOW (ref 13.0–18.0)
Lymphocyte #: 0.7 10*3/uL — ABNORMAL LOW (ref 1.0–3.6)
Lymphocyte %: 10.8 %
MCH: 35.3 pg — ABNORMAL HIGH (ref 26.0–34.0)
MCHC: 32.9 g/dL (ref 32.0–36.0)
Monocyte #: 0.3 x10 3/mm (ref 0.2–1.0)
Neutrophil #: 5.6 10*3/uL (ref 1.4–6.5)
Platelet: 218 10*3/uL (ref 150–440)
RBC: 2.14 10*6/uL — ABNORMAL LOW (ref 4.40–5.90)
RDW: 19.5 % — ABNORMAL HIGH (ref 11.5–14.5)
WBC: 6.6 10*3/uL (ref 3.8–10.6)

## 2011-12-13 LAB — CBC WITH DIFFERENTIAL/PLATELET
Basophil %: 0.1 %
Eosinophil %: 0.2 %
HCT: 26.9 % — ABNORMAL LOW (ref 40.0–52.0)
HGB: 8.8 g/dL — ABNORMAL LOW (ref 13.0–18.0)
Lymphocyte #: 1.5 10*3/uL (ref 1.0–3.6)
Lymphocyte %: 24.5 %
MCH: 35.6 pg — ABNORMAL HIGH (ref 26.0–34.0)
MCHC: 32.8 g/dL (ref 32.0–36.0)
Monocyte #: 0.7 x10 3/mm (ref 0.2–1.0)
Monocyte %: 10.8 %
Neutrophil #: 4.1 10*3/uL (ref 1.4–6.5)
Neutrophil %: 64.4 %
RBC: 2.47 10*6/uL — ABNORMAL LOW (ref 4.40–5.90)

## 2011-12-17 ENCOUNTER — Inpatient Hospital Stay: Payer: Self-pay | Admitting: Internal Medicine

## 2011-12-17 LAB — COMPREHENSIVE METABOLIC PANEL
Albumin: 3.1 g/dL — ABNORMAL LOW (ref 3.4–5.0)
Anion Gap: 5 — ABNORMAL LOW (ref 7–16)
BUN: 11 mg/dL (ref 7–18)
Calcium, Total: 9.1 mg/dL (ref 8.5–10.1)
Chloride: 100 mmol/L (ref 98–107)
Co2: 36 mmol/L — ABNORMAL HIGH (ref 21–32)
EGFR (African American): >60
EGFR (Non-African Amer.): >60
Potassium: 4.3 mmol/L (ref 3.5–5.1)
SGOT(AST): 54 U/L — ABNORMAL HIGH (ref 15–37)
SGPT (ALT): 34 U/L (ref 12–78)
Total Protein: 7.5 g/dL (ref 6.4–8.2)

## 2011-12-17 LAB — CK TOTAL AND CKMB (NOT AT ARMC)
CK, Total: 70 U/L (ref 35–232)
CK, Total: 20 U/L — ABNORMAL LOW (ref 35–232)
CK-MB: 1.7 ng/mL (ref 0.5–3.6)

## 2011-12-17 LAB — PRO B NATRIURETIC PEPTIDE: B-Type Natriuretic Peptide: 3197 pg/mL — ABNORMAL HIGH (ref 0–125)

## 2011-12-17 LAB — CBC
MCH: 32.8 pg (ref 26.0–34.0)
MCHC: 31.5 g/dL — ABNORMAL LOW (ref 32.0–36.0)
MCV: 104 fL — ABNORMAL HIGH (ref 80–100)
Platelet: 307 10*3/uL (ref 150–440)
RBC: 3.18 10*6/uL — ABNORMAL LOW (ref 4.40–5.90)
RDW: 18.3 % — ABNORMAL HIGH (ref 11.5–14.5)

## 2011-12-17 LAB — MAGNESIUM: Magnesium: 1.7 mg/dL — ABNORMAL LOW

## 2011-12-17 LAB — LIPASE, BLOOD: Lipase: 175 U/L (ref 73–393)

## 2011-12-18 LAB — COMPREHENSIVE METABOLIC PANEL
Albumin: 2.8 g/dL — ABNORMAL LOW (ref 3.4–5.0)
Alkaline Phosphatase: 90 U/L (ref 50–136)
Anion Gap: 4 — ABNORMAL LOW (ref 7–16)
Calcium, Total: 9 mg/dL (ref 8.5–10.1)
Co2: 39 mmol/L — ABNORMAL HIGH (ref 21–32)
Creatinine: 0.56 mg/dL — ABNORMAL LOW (ref 0.60–1.30)
EGFR (Non-African Amer.): >60
Glucose: 156 mg/dL — ABNORMAL HIGH (ref 65–99)
Osmolality: 278 (ref 275–301)
SGOT(AST): 13 U/L — ABNORMAL LOW (ref 15–37)
SGPT (ALT): 29 U/L (ref 12–78)

## 2011-12-18 LAB — CBC WITH DIFFERENTIAL/PLATELET
Basophil %: 0 %
Eosinophil #: 0 10*3/uL (ref 0.0–0.7)
Eosinophil %: 0.1 %
HCT: 29.8 % — ABNORMAL LOW (ref 40.0–52.0)
Lymphocyte #: 0.6 10*3/uL — ABNORMAL LOW (ref 1.0–3.6)
MCH: 33 pg (ref 26.0–34.0)
MCHC: 32.4 g/dL (ref 32.0–36.0)
MCV: 102 fL — ABNORMAL HIGH (ref 80–100)
Monocyte #: 0.1 x10 3/mm — ABNORMAL LOW (ref 0.2–1.0)
Neutrophil %: 90.4 %
Platelet: 240 10*3/uL (ref 150–440)
RBC: 2.92 10*6/uL — ABNORMAL LOW (ref 4.40–5.90)

## 2011-12-18 LAB — VANCOMYCIN, TROUGH: Vancomycin, Trough: 16 ug/mL (ref 10–20)

## 2011-12-18 LAB — RAPID HIV-1/2 QL/CONFIRM: HIV-1/2,Rapid Ql: NEGATIVE

## 2011-12-19 LAB — MAGNESIUM: Magnesium: 2 mg/dL

## 2011-12-19 LAB — HEMOGLOBIN: HGB: 9.6 g/dL — ABNORMAL LOW (ref 13.0–18.0)

## 2011-12-20 LAB — BASIC METABOLIC PANEL
BUN: 18 mg/dL (ref 7–18)
Creatinine: 0.81 mg/dL (ref 0.60–1.30)
EGFR (African American): >60
EGFR (Non-African Amer.): >60
Glucose: 224 mg/dL — ABNORMAL HIGH (ref 65–99)
Potassium: 3.8 mmol/L (ref 3.5–5.1)
Sodium: 138 mmol/L (ref 136–145)

## 2011-12-21 LAB — BASIC METABOLIC PANEL
Anion Gap: 6 — ABNORMAL LOW (ref 7–16)
BUN: 24 mg/dL — ABNORMAL HIGH (ref 7–18)
Calcium, Total: 9.7 mg/dL (ref 8.5–10.1)
Chloride: 100 mmol/L (ref 98–107)
Co2: 34 mmol/L — ABNORMAL HIGH (ref 21–32)
Osmolality: 284 (ref 275–301)
Potassium: 3.5 mmol/L (ref 3.5–5.1)
Sodium: 140 mmol/L (ref 136–145)

## 2011-12-21 LAB — CBC WITH DIFFERENTIAL/PLATELET
Basophil #: 0 10*3/uL (ref 0.0–0.1)
Basophil %: 0.1 %
Eosinophil #: 0 10*3/uL (ref 0.0–0.7)
HCT: 34.8 % — ABNORMAL LOW (ref 40.0–52.0)
HGB: 11.2 g/dL — ABNORMAL LOW (ref 13.0–18.0)
Lymphocyte #: 0.6 10*3/uL — ABNORMAL LOW (ref 1.0–3.6)
Lymphocyte %: 9.3 %
MCHC: 32.2 g/dL (ref 32.0–36.0)
MCV: 102 fL — ABNORMAL HIGH (ref 80–100)
Monocyte #: 0.4 x10 3/mm (ref 0.2–1.0)
Monocyte %: 6.1 %
Neutrophil #: 5.8 10*3/uL (ref 1.4–6.5)

## 2011-12-22 LAB — BASIC METABOLIC PANEL
Anion Gap: 4 — ABNORMAL LOW (ref 7–16)
BUN: 29 mg/dL — ABNORMAL HIGH (ref 7–18)
Calcium, Total: 9.2 mg/dL (ref 8.5–10.1)
Co2: 30 mmol/L (ref 21–32)
EGFR (Non-African Amer.): >60
Glucose: 143 mg/dL — ABNORMAL HIGH (ref 65–99)
Osmolality: 290 (ref 275–301)
Potassium: 3.5 mmol/L (ref 3.5–5.1)

## 2011-12-22 LAB — MAGNESIUM: Magnesium: 2 mg/dL

## 2011-12-23 LAB — CULTURE, BLOOD (SINGLE)

## 2011-12-24 LAB — BASIC METABOLIC PANEL
BUN: 31 mg/dL — ABNORMAL HIGH (ref 7–18)
Calcium, Total: 9.2 mg/dL (ref 8.5–10.1)
Chloride: 111 mmol/L — ABNORMAL HIGH (ref 98–107)
Co2: 31 mmol/L (ref 21–32)
EGFR (Non-African Amer.): >60
Glucose: 144 mg/dL — ABNORMAL HIGH (ref 65–99)
Osmolality: 298 (ref 275–301)
Potassium: 3.8 mmol/L (ref 3.5–5.1)
Sodium: 145 mmol/L (ref 136–145)

## 2011-12-24 LAB — CBC WITH DIFFERENTIAL/PLATELET
Basophil #: 0 10*3/uL (ref 0.0–0.1)
Basophil %: 0.1 %
Eosinophil #: 0 10*3/uL (ref 0.0–0.7)
Lymphocyte #: 0.4 10*3/uL — ABNORMAL LOW (ref 1.0–3.6)
Monocyte #: 0.2 x10 3/mm (ref 0.2–1.0)
Monocyte %: 3.4 %
Neutrophil #: 6.1 10*3/uL (ref 1.4–6.5)
Platelet: 153 10*3/uL (ref 150–440)
RBC: 3.57 10*6/uL — ABNORMAL LOW (ref 4.40–5.90)
RDW: 17.3 % — ABNORMAL HIGH (ref 11.5–14.5)
WBC: 6.7 10*3/uL (ref 3.8–10.6)

## 2011-12-25 LAB — BASIC METABOLIC PANEL
Anion Gap: 4 — ABNORMAL LOW (ref 7–16)
BUN: 30 mg/dL — ABNORMAL HIGH (ref 7–18)
Calcium, Total: 9.1 mg/dL (ref 8.5–10.1)
EGFR (African American): >60
EGFR (Non-African Amer.): >60
Glucose: 151 mg/dL — ABNORMAL HIGH (ref 65–99)
Osmolality: 298 (ref 275–301)
Sodium: 145 mmol/L (ref 136–145)

## 2011-12-25 LAB — CBC WITH DIFFERENTIAL/PLATELET
Basophil #: 0 10*3/uL (ref 0.0–0.1)
Basophil %: 0.1 %
Eosinophil %: 0.1 %
HCT: 34.7 % — ABNORMAL LOW (ref 40.0–52.0)
Lymphocyte #: 0.3 10*3/uL — ABNORMAL LOW (ref 1.0–3.6)
MCH: 31.2 pg (ref 26.0–34.0)
MCV: 100 fL (ref 80–100)
Monocyte #: 0.2 x10 3/mm (ref 0.2–1.0)
Monocyte %: 3.6 %
Platelet: 133 10*3/uL — ABNORMAL LOW (ref 150–440)
RDW: 16.7 % — ABNORMAL HIGH (ref 11.5–14.5)
WBC: 6 10*3/uL (ref 3.8–10.6)

## 2011-12-26 LAB — CBC WITH DIFFERENTIAL/PLATELET
Basophil #: 0 10*3/uL (ref 0.0–0.1)
Basophil %: 0.1 %
Eosinophil %: 0 %
HCT: 34.9 % — ABNORMAL LOW (ref 40.0–52.0)
HGB: 11.3 g/dL — ABNORMAL LOW (ref 13.0–18.0)
Lymphocyte %: 4 %
MCH: 32 pg (ref 26.0–34.0)
MCV: 99 fL (ref 80–100)
Monocyte #: 0.2 x10 3/mm (ref 0.2–1.0)
Monocyte %: 3.5 %
Platelet: 131 10*3/uL — ABNORMAL LOW (ref 150–440)
RBC: 3.52 10*6/uL — ABNORMAL LOW (ref 4.40–5.90)
RDW: 16.4 % — ABNORMAL HIGH (ref 11.5–14.5)
WBC: 6.9 10*3/uL (ref 3.8–10.6)

## 2011-12-26 LAB — BASIC METABOLIC PANEL
BUN: 26 mg/dL — ABNORMAL HIGH (ref 7–18)
Calcium, Total: 8.7 mg/dL (ref 8.5–10.1)
Co2: 28 mmol/L (ref 21–32)
EGFR (African American): >60
EGFR (Non-African Amer.): >60
Glucose: 144 mg/dL — ABNORMAL HIGH (ref 65–99)
Osmolality: 289 (ref 275–301)
Potassium: 3.8 mmol/L (ref 3.5–5.1)
Sodium: 141 mmol/L (ref 136–145)

## 2012-03-15 ENCOUNTER — Inpatient Hospital Stay: Payer: Self-pay | Admitting: Internal Medicine

## 2012-03-15 LAB — CBC
HGB: 13.5 g/dL (ref 13.0–18.0)
MCH: 28.2 pg (ref 26.0–34.0)
MCHC: 31.9 g/dL — ABNORMAL LOW (ref 32.0–36.0)
RDW: 16.6 % — ABNORMAL HIGH (ref 11.5–14.5)
WBC: 11.9 10*3/uL — ABNORMAL HIGH (ref 3.8–10.6)

## 2012-03-15 LAB — COMPREHENSIVE METABOLIC PANEL
Albumin: 3.8 g/dL (ref 3.4–5.0)
Alkaline Phosphatase: 104 U/L (ref 50–136)
Anion Gap: 2 — ABNORMAL LOW (ref 7–16)
Calcium, Total: 8.8 mg/dL (ref 8.5–10.1)
Chloride: 93 mmol/L — ABNORMAL LOW (ref 98–107)
Co2: 40 mmol/L (ref 21–32)
Creatinine: 0.6 mg/dL (ref 0.60–1.30)
EGFR (African American): >60
EGFR (Non-African Amer.): >60
Glucose: 109 mg/dL — ABNORMAL HIGH (ref 65–99)
Osmolality: 269 (ref 275–301)
SGOT(AST): 23 U/L (ref 15–37)
Sodium: 135 mmol/L — ABNORMAL LOW (ref 136–145)
Total Protein: 7.6 g/dL (ref 6.4–8.2)

## 2012-03-15 LAB — CK TOTAL AND CKMB (NOT AT ARMC)
CK, Total: 40 U/L (ref 35–232)
CK-MB: 2.2 ng/mL (ref 0.5–3.6)

## 2012-03-15 LAB — PRO B NATRIURETIC PEPTIDE: B-Type Natriuretic Peptide: 10770 pg/mL — ABNORMAL HIGH (ref 0–125)

## 2012-03-16 LAB — CBC WITH DIFFERENTIAL/PLATELET
Basophil #: 0 10*3/uL (ref 0.0–0.1)
Eosinophil #: 0 10*3/uL (ref 0.0–0.7)
Eosinophil %: 0 %
HGB: 12.5 g/dL — ABNORMAL LOW (ref 13.0–18.0)
Lymphocyte #: 1.1 10*3/uL (ref 1.0–3.6)
MCH: 28.1 pg (ref 26.0–34.0)
MCHC: 31.7 g/dL — ABNORMAL LOW (ref 32.0–36.0)
MCV: 89 fL (ref 80–100)
Monocyte #: 0.2 x10 3/mm (ref 0.2–1.0)
Neutrophil #: 5.4 10*3/uL (ref 1.4–6.5)
RBC: 4.45 10*6/uL (ref 4.40–5.90)
RDW: 16.6 % — ABNORMAL HIGH (ref 11.5–14.5)
WBC: 6.7 10*3/uL (ref 3.8–10.6)

## 2012-03-16 LAB — LIPID PANEL
Ldl Cholesterol, Calc: 125 mg/dL — ABNORMAL HIGH (ref 0–100)
Triglycerides: 99 mg/dL (ref 0–200)
VLDL Cholesterol, Calc: 20 mg/dL (ref 5–40)

## 2012-03-16 LAB — BASIC METABOLIC PANEL
BUN: 12 mg/dL (ref 7–18)
Calcium, Total: 9 mg/dL (ref 8.5–10.1)
Co2: 43 mmol/L (ref 21–32)
EGFR (African American): >60
EGFR (Non-African Amer.): >60
Glucose: 134 mg/dL — ABNORMAL HIGH (ref 65–99)
Osmolality: 279 (ref 275–301)
Sodium: 139 mmol/L (ref 136–145)

## 2012-03-16 LAB — TROPONIN I: Troponin-I: <0.02 ng/mL

## 2012-03-17 LAB — CBC WITH DIFFERENTIAL/PLATELET
Basophil #: 0 x10 3/mm 3
Basophil %: 0.3 %
Eosinophil #: 0 x10 3/mm 3
Eosinophil %: 0.1 %
HCT: 39.9 % — ABNORMAL LOW
HGB: 12.6 g/dL — ABNORMAL LOW
Lymphocyte %: 15.7 %
Lymphs Abs: 2.5 x10 3/mm 3
MCH: 28.1 pg
MCHC: 31.5 g/dL — ABNORMAL LOW
MCV: 89 fL
Monocyte #: 1.6 "x10 3/mm " — ABNORMAL HIGH
Monocyte %: 10.3 %
Neutrophil #: 11.5 x10 3/mm 3 — ABNORMAL HIGH
Neutrophil %: 73.6 %
Platelet: 198 x10 3/mm 3
RBC: 4.47 x10 6/mm 3
RDW: 16.8 % — ABNORMAL HIGH
WBC: 15.6 x10 3/mm 3 — ABNORMAL HIGH

## 2012-03-17 LAB — BASIC METABOLIC PANEL WITH GFR
Anion Gap: 5 — ABNORMAL LOW
BUN: 15 mg/dL
Calcium, Total: 8.9 mg/dL
Chloride: 92 mmol/L — ABNORMAL LOW
Co2: 43 mmol/L
Creatinine: 0.69 mg/dL
EGFR (African American): >60
EGFR (Non-African Amer.): >60
Glucose: 99 mg/dL
Osmolality: 280
Potassium: 3.3 mmol/L — ABNORMAL LOW
Sodium: 140 mmol/L

## 2012-03-18 LAB — BASIC METABOLIC PANEL
Anion Gap: 4 — ABNORMAL LOW (ref 7–16)
BUN: 18 mg/dL (ref 7–18)
Co2: 43 mmol/L (ref 21–32)
Creatinine: 0.62 mg/dL (ref 0.60–1.30)
EGFR (African American): >60
EGFR (Non-African Amer.): >60
Glucose: 101 mg/dL — ABNORMAL HIGH (ref 65–99)
Potassium: 3.3 mmol/L — ABNORMAL LOW (ref 3.5–5.1)

## 2012-03-18 LAB — CBC WITH DIFFERENTIAL/PLATELET
Basophil #: 0 10*3/uL (ref 0.0–0.1)
Eosinophil #: 0 10*3/uL (ref 0.0–0.7)
Eosinophil %: 0.2 %
HGB: 13.2 g/dL (ref 13.0–18.0)
Lymphocyte #: 2.9 10*3/uL (ref 1.0–3.6)
Lymphocyte %: 23.6 %
MCH: 28 pg (ref 26.0–34.0)
MCHC: 31.3 g/dL — ABNORMAL LOW (ref 32.0–36.0)
Monocyte #: 1.3 x10 3/mm — ABNORMAL HIGH (ref 0.2–1.0)
Monocyte %: 10.4 %
Neutrophil #: 8.1 10*3/uL — ABNORMAL HIGH (ref 1.4–6.5)
Neutrophil %: 65.6 %
Platelet: 191 10*3/uL (ref 150–440)
WBC: 12.3 10*3/uL — ABNORMAL HIGH (ref 3.8–10.6)

## 2012-03-19 LAB — PHOSPHORUS: Phosphorus: 3.5 mg/dL (ref 2.5–4.9)

## 2013-03-06 ENCOUNTER — Inpatient Hospital Stay: Payer: Self-pay | Admitting: Internal Medicine

## 2013-03-06 LAB — CBC
HCT: 49 % (ref 40.0–52.0)
HGB: 16.6 g/dL (ref 13.0–18.0)
MCH: 32.3 pg (ref 26.0–34.0)
MCHC: 33.8 g/dL (ref 32.0–36.0)
MCV: 95 fL (ref 80–100)
Platelet: 199 10*3/uL (ref 150–440)
RBC: 5.14 10*6/uL (ref 4.40–5.90)
RDW: 13.8 % (ref 11.5–14.5)
WBC: 21.8 10*3/uL — ABNORMAL HIGH (ref 3.8–10.6)

## 2013-03-06 LAB — BASIC METABOLIC PANEL
ANION GAP: 3 — AB (ref 7–16)
Anion Gap: 3 — ABNORMAL LOW (ref 7–16)
Anion Gap: 3 — ABNORMAL LOW (ref 7–16)
BUN: 8 mg/dL (ref 7–18)
BUN: 10 mg/dL (ref 7–18)
BUN: 12 mg/dL (ref 7–18)
CALCIUM: 9 mg/dL (ref 8.5–10.1)
CHLORIDE: 69 mmol/L — AB (ref 98–107)
CHLORIDE: 70 mmol/L — AB (ref 98–107)
CO2: 37 mmol/L — AB (ref 21–32)
CO2: 39 mmol/L — AB (ref 21–32)
CREATININE: 0.79 mg/dL (ref 0.60–1.30)
Calcium, Total: 8.7 mg/dL (ref 8.5–10.1)
Calcium, Total: 8.8 mg/dL (ref 8.5–10.1)
Chloride: 73 mmol/L — ABNORMAL LOW (ref 98–107)
Co2: 39 mmol/L — ABNORMAL HIGH (ref 21–32)
Creatinine: 0.59 mg/dL — ABNORMAL LOW (ref 0.60–1.30)
Creatinine: 0.6 mg/dL (ref 0.60–1.30)
EGFR (African American): >60
EGFR (African American): >60
EGFR (Non-African Amer.): >60
EGFR (Non-African Amer.): >60
GLUCOSE: 118 mg/dL — AB (ref 65–99)
Glucose: 126 mg/dL — ABNORMAL HIGH (ref 65–99)
Glucose: 134 mg/dL — ABNORMAL HIGH (ref 65–99)
OSMOLALITY: 222 (ref 275–301)
OSMOLALITY: 227 (ref 275–301)
Osmolality: 235 (ref 275–301)
POTASSIUM: 4.1 mmol/L (ref 3.5–5.1)
POTASSIUM: 4 mmol/L (ref 3.5–5.1)
Potassium: 4.3 mmol/L (ref 3.5–5.1)
Sodium: 109 mmol/L — CL (ref 136–145)
Sodium: 112 mmol/L — CL (ref 136–145)
Sodium: 115 mmol/L — CL (ref 136–145)

## 2013-03-06 LAB — COMPREHENSIVE METABOLIC PANEL
ALK PHOS: 145 U/L — AB
ANION GAP: 1 — AB (ref 7–16)
Albumin: 3.3 g/dL — ABNORMAL LOW (ref 3.4–5.0)
BUN: 7 mg/dL (ref 7–18)
Bilirubin,Total: 1.7 mg/dL — ABNORMAL HIGH (ref 0.2–1.0)
CHLORIDE: 70 mmol/L — AB (ref 98–107)
CO2: 36 mmol/L — AB (ref 21–32)
Calcium, Total: 8.5 mg/dL (ref 8.5–10.1)
Creatinine: 0.49 mg/dL — ABNORMAL LOW (ref 0.60–1.30)
EGFR (African American): >60
EGFR (Non-African Amer.): >60
Glucose: 177 mg/dL — ABNORMAL HIGH (ref 65–99)
OSMOLALITY: 220 (ref 275–301)
Potassium: 4.7 mmol/L (ref 3.5–5.1)
SGOT(AST): 31 U/L (ref 15–37)
SGPT (ALT): 18 U/L (ref 12–78)
Sodium: 107 mmol/L — CL (ref 136–145)
Total Protein: 8 g/dL (ref 6.4–8.2)

## 2013-03-06 LAB — SODIUM, URINE, RANDOM: Sodium, Urine Random: 10 mmol/L (ref 20–110)

## 2013-03-06 LAB — TROPONIN I
Troponin-I: <0.02 ng/mL
Troponin-I: <0.02 ng/mL

## 2013-03-06 LAB — BILIRUBIN, DIRECT: BILIRUBIN DIRECT: 0.6 mg/dL — AB (ref 0.00–0.20)

## 2013-03-06 LAB — CHLORIDE, URINE, RANDOM: Chloride, Urine Random: 33 mmol/L — ABNORMAL LOW (ref 55–125)

## 2013-03-06 LAB — PRO B NATRIURETIC PEPTIDE: B-Type Natriuretic Peptide: 3819 pg/mL — ABNORMAL HIGH (ref 0–125)

## 2013-03-06 LAB — CK TOTAL AND CKMB (NOT AT ARMC)
CK, TOTAL: 37 U/L (ref 35–232)
CK, Total: 42 U/L (ref 35–232)
CK, Total: 40 U/L (ref 35–232)
CK-MB: 4 ng/mL — ABNORMAL HIGH (ref 0.5–3.6)
CK-MB: 3.4 ng/mL (ref 0.5–3.6)
CK-MB: 3.3 ng/mL (ref 0.5–3.6)

## 2013-03-06 LAB — OSMOLALITY, URINE: Osmolality: 332 mOsm/kg

## 2013-03-06 LAB — HEMOGLOBIN A1C: HEMOGLOBIN A1C: 5.3 % (ref 4.2–6.3)

## 2013-03-07 DIAGNOSIS — I519 Heart disease, unspecified: Secondary | ICD-10-CM

## 2013-03-07 LAB — BASIC METABOLIC PANEL
ANION GAP: 3 — AB (ref 7–16)
BUN: 12 mg/dL (ref 7–18)
CO2: 37 mmol/L — AB (ref 21–32)
CREATININE: 0.67 mg/dL (ref 0.60–1.30)
Calcium, Total: 8.8 mg/dL (ref 8.5–10.1)
Chloride: 77 mmol/L — ABNORMAL LOW (ref 98–107)
EGFR (African American): >60
Glucose: 125 mg/dL — ABNORMAL HIGH (ref 65–99)
Osmolality: 238 (ref 275–301)
POTASSIUM: 3.9 mmol/L (ref 3.5–5.1)
Sodium: 117 mmol/L — CL (ref 136–145)

## 2013-03-07 LAB — CBC WITH DIFFERENTIAL/PLATELET
BASOS PCT: 0.1 %
Basophil #: 0 10*3/uL (ref 0.0–0.1)
EOS ABS: 0 10*3/uL (ref 0.0–0.7)
Eosinophil %: 0 %
HCT: 44.9 % (ref 40.0–52.0)
HGB: 15.2 g/dL (ref 13.0–18.0)
LYMPHS ABS: 0.8 10*3/uL — AB (ref 1.0–3.6)
Lymphocyte %: 8.3 %
MCH: 32.6 pg (ref 26.0–34.0)
MCHC: 33.9 g/dL (ref 32.0–36.0)
MCV: 96 fL (ref 80–100)
Monocyte #: 0.4 x10 3/mm (ref 0.2–1.0)
Monocyte %: 4.5 %
NEUTROS PCT: 87.1 %
Neutrophil #: 8.2 10*3/uL — ABNORMAL HIGH (ref 1.4–6.5)
PLATELETS: 154 10*3/uL (ref 150–440)
RBC: 4.67 10*6/uL (ref 4.40–5.90)
RDW: 14.1 % (ref 11.5–14.5)
WBC: 9.4 10*3/uL (ref 3.8–10.6)

## 2013-03-07 LAB — COMPREHENSIVE METABOLIC PANEL
Albumin: 3 g/dL — ABNORMAL LOW (ref 3.4–5.0)
Alkaline Phosphatase: 115 U/L
Anion Gap: 3 — ABNORMAL LOW (ref 7–16)
BUN: 12 mg/dL (ref 7–18)
Bilirubin,Total: 0.9 mg/dL (ref 0.2–1.0)
CO2: 39 mmol/L — AB (ref 21–32)
CREATININE: 0.72 mg/dL (ref 0.60–1.30)
Calcium, Total: 9.1 mg/dL (ref 8.5–10.1)
Chloride: 79 mmol/L — ABNORMAL LOW (ref 98–107)
Glucose: 126 mg/dL — ABNORMAL HIGH (ref 65–99)
Osmolality: 245 (ref 275–301)
POTASSIUM: 3.9 mmol/L (ref 3.5–5.1)
SGOT(AST): 21 U/L (ref 15–37)
SGPT (ALT): 16 U/L (ref 12–78)
SODIUM: 121 mmol/L — AB (ref 136–145)
TOTAL PROTEIN: 7.3 g/dL (ref 6.4–8.2)

## 2013-03-07 LAB — MAGNESIUM: Magnesium: 1.5 mg/dL — ABNORMAL LOW

## 2013-03-07 LAB — CHLORIDE, URINE, RANDOM

## 2013-03-07 LAB — SODIUM, URINE, RANDOM: SODIUM, URINE RANDOM: 7 mmol/L (ref 20–110)

## 2013-03-07 LAB — POTASSIUM, URINE RANDOM: POTASSIUM, URINE RANDOM: 13 mmol/L — AB (ref 55–125)

## 2013-03-07 LAB — OSMOLALITY, URINE: Osmolality: 222 mOsm/kg

## 2013-03-08 DIAGNOSIS — I10 Essential (primary) hypertension: Secondary | ICD-10-CM

## 2013-03-08 DIAGNOSIS — I5033 Acute on chronic diastolic (congestive) heart failure: Secondary | ICD-10-CM

## 2013-03-08 LAB — BASIC METABOLIC PANEL
Anion Gap: 3 — ABNORMAL LOW (ref 7–16)
BUN: 18 mg/dL (ref 7–18)
CO2: 42 mmol/L — AB (ref 21–32)
Calcium, Total: 8.2 mg/dL — ABNORMAL LOW (ref 8.5–10.1)
Chloride: 79 mmol/L — ABNORMAL LOW (ref 98–107)
Creatinine: 0.75 mg/dL (ref 0.60–1.30)
EGFR (African American): >60
EGFR (Non-African Amer.): >60
GLUCOSE: 143 mg/dL — AB (ref 65–99)
Osmolality: 254 (ref 275–301)
POTASSIUM: 4.2 mmol/L (ref 3.5–5.1)
SODIUM: 124 mmol/L — AB (ref 136–145)

## 2013-03-08 LAB — CHLORIDE, URINE, RANDOM

## 2013-03-08 LAB — POTASSIUM, URINE RANDOM: Potassium, Urine Random: 20 mmol/L — ABNORMAL LOW (ref 55–125)

## 2013-03-08 LAB — SODIUM, URINE, RANDOM

## 2013-03-08 LAB — OSMOLALITY, URINE: Osmolality: 544 mOsm/kg

## 2013-03-09 LAB — BASIC METABOLIC PANEL
BUN: 21 mg/dL — AB (ref 7–18)
CALCIUM: 8.7 mg/dL (ref 8.5–10.1)
CHLORIDE: 83 mmol/L — AB (ref 98–107)
Co2: 45 mmol/L (ref 21–32)
Creatinine: 0.72 mg/dL (ref 0.60–1.30)
EGFR (African American): >60
EGFR (Non-African Amer.): >60
Glucose: 131 mg/dL — ABNORMAL HIGH (ref 65–99)
OSMOLALITY: 260 (ref 275–301)
POTASSIUM: 3.8 mmol/L (ref 3.5–5.1)
SODIUM: 127 mmol/L — AB (ref 136–145)

## 2013-03-10 LAB — BASIC METABOLIC PANEL
Anion Gap: 0 — ABNORMAL LOW (ref 7–16)
BUN: 24 mg/dL — AB (ref 7–18)
CHLORIDE: 85 mmol/L — AB (ref 98–107)
CREATININE: 0.76 mg/dL (ref 0.60–1.30)
Calcium, Total: 9 mg/dL (ref 8.5–10.1)
Co2: 44 mmol/L (ref 21–32)
EGFR (African American): >60
EGFR (Non-African Amer.): >60
GLUCOSE: 129 mg/dL — AB (ref 65–99)
Osmolality: 265 (ref 275–301)
Potassium: 3.9 mmol/L (ref 3.5–5.1)
SODIUM: 129 mmol/L — AB (ref 136–145)

## 2013-03-11 LAB — CULTURE, BLOOD (SINGLE)

## 2013-03-14 ENCOUNTER — Telehealth: Payer: Self-pay | Admitting: *Deleted

## 2013-03-14 NOTE — Telephone Encounter (Signed)
LVM for TCM call 

## 2013-03-17 ENCOUNTER — Encounter: Payer: Medicare Other | Admitting: Cardiovascular Disease

## 2013-11-07 ENCOUNTER — Inpatient Hospital Stay: Payer: Self-pay | Admitting: Internal Medicine

## 2013-11-07 LAB — CBC
HCT: 42.6 % (ref 40.0–52.0)
HGB: 13.8 g/dL (ref 13.0–18.0)
MCH: 31.9 pg (ref 26.0–34.0)
MCHC: 32.5 g/dL (ref 32.0–36.0)
MCV: 98 fL (ref 80–100)
PLATELETS: 123 10*3/uL — AB (ref 150–440)
RBC: 4.34 10*6/uL — ABNORMAL LOW (ref 4.40–5.90)
RDW: 13.7 % (ref 11.5–14.5)
WBC: 9.4 10*3/uL (ref 3.8–10.6)

## 2013-11-07 LAB — BASIC METABOLIC PANEL
Anion Gap: 5 — ABNORMAL LOW (ref 7–16)
BUN: 11 mg/dL (ref 7–18)
Calcium, Total: 8.1 mg/dL — ABNORMAL LOW (ref 8.5–10.1)
Chloride: 77 mmol/L — ABNORMAL LOW (ref 98–107)
Co2: 38 mmol/L — ABNORMAL HIGH (ref 21–32)
Creatinine: 0.54 mg/dL — ABNORMAL LOW (ref 0.60–1.30)
EGFR (Non-African Amer.): >60
Glucose: 131 mg/dL — ABNORMAL HIGH (ref 65–99)
Osmolality: 243 (ref 275–301)
Potassium: 5 mmol/L (ref 3.5–5.1)
SODIUM: 120 mmol/L — AB (ref 136–145)

## 2013-11-07 LAB — TROPONIN I: TROPONIN-I: 0.03 ng/mL

## 2013-11-08 LAB — BASIC METABOLIC PANEL
Anion Gap: 11 (ref 7–16)
BUN: 12 mg/dL (ref 7–18)
Calcium, Total: 7.9 mg/dL — ABNORMAL LOW (ref 8.5–10.1)
Chloride: 80 mmol/L — ABNORMAL LOW (ref 98–107)
Co2: 33 mmol/L — ABNORMAL HIGH (ref 21–32)
Creatinine: 0.61 mg/dL (ref 0.60–1.30)
EGFR (African American): >60
Glucose: 126 mg/dL — ABNORMAL HIGH (ref 65–99)
Osmolality: 251 (ref 275–301)
Potassium: 4.7 mmol/L (ref 3.5–5.1)
Sodium: 124 mmol/L — ABNORMAL LOW (ref 136–145)

## 2013-11-08 LAB — CBC WITH DIFFERENTIAL/PLATELET
BASOS ABS: 0 10*3/uL (ref 0.0–0.1)
Basophil %: 0.4 %
EOS PCT: 0.1 %
Eosinophil #: 0 10*3/uL (ref 0.0–0.7)
HCT: 41.1 % (ref 40.0–52.0)
HGB: 13.6 g/dL (ref 13.0–18.0)
LYMPHS ABS: 0.9 10*3/uL — AB (ref 1.0–3.6)
Lymphocyte %: 13 %
MCH: 32 pg (ref 26.0–34.0)
MCHC: 32.9 g/dL (ref 32.0–36.0)
MCV: 97 fL (ref 80–100)
Monocyte #: 0.4 x10 3/mm (ref 0.2–1.0)
Monocyte %: 5.8 %
NEUTROS PCT: 80.7 %
Neutrophil #: 5.4 10*3/uL (ref 1.4–6.5)
PLATELETS: 102 10*3/uL — AB (ref 150–440)
RBC: 4.23 10*6/uL — ABNORMAL LOW (ref 4.40–5.90)
RDW: 14.1 % (ref 11.5–14.5)
WBC: 6.7 10*3/uL (ref 3.8–10.6)

## 2013-11-08 LAB — CK-MB
CK-MB: 3.2 ng/mL (ref 0.5–3.6)
CK-MB: 2.3 ng/mL (ref 0.5–3.6)
CK-MB: 1.4 ng/mL (ref 0.5–3.6)

## 2013-11-08 LAB — TROPONIN I
TROPONIN-I: 0.02 ng/mL
Troponin-I: <0.02 ng/mL

## 2013-11-08 LAB — PRO B NATRIURETIC PEPTIDE: B-TYPE NATIURETIC PEPTID: 808 pg/mL — AB (ref 0–125)

## 2013-11-09 ENCOUNTER — Ambulatory Visit: Payer: Self-pay | Admitting: Internal Medicine

## 2013-11-09 LAB — CBC WITH DIFFERENTIAL/PLATELET
BASOS ABS: 0.1 10*3/uL (ref 0.0–0.1)
Basophil %: 0.6 %
Eosinophil #: 0 10*3/uL (ref 0.0–0.7)
Eosinophil %: 0.3 %
HCT: 42.8 % (ref 40.0–52.0)
HGB: 13.9 g/dL (ref 13.0–18.0)
LYMPHS ABS: 1.1 10*3/uL (ref 1.0–3.6)
Lymphocyte %: 10.3 %
MCH: 31.8 pg (ref 26.0–34.0)
MCHC: 32.4 g/dL (ref 32.0–36.0)
MCV: 98 fL (ref 80–100)
MONO ABS: 0.7 x10 3/mm (ref 0.2–1.0)
Monocyte %: 6.6 %
NEUTROS ABS: 8.7 10*3/uL — AB (ref 1.4–6.5)
Neutrophil %: 82.2 %
PLATELETS: 110 10*3/uL — AB (ref 150–440)
RBC: 4.36 10*6/uL — AB (ref 4.40–5.90)
RDW: 14.2 % (ref 11.5–14.5)
WBC: 10.6 10*3/uL (ref 3.8–10.6)

## 2013-11-09 LAB — BASIC METABOLIC PANEL
Anion Gap: 7 (ref 7–16)
BUN: 11 mg/dL (ref 7–18)
CALCIUM: 8.4 mg/dL — AB (ref 8.5–10.1)
CO2: 39 mmol/L — AB (ref 21–32)
Chloride: 80 mmol/L — ABNORMAL LOW (ref 98–107)
Creatinine: 0.62 mg/dL (ref 0.60–1.30)
EGFR (African American): >60
EGFR (Non-African Amer.): >60
GLUCOSE: 125 mg/dL — AB (ref 65–99)
Osmolality: 254 (ref 275–301)
POTASSIUM: 4.4 mmol/L (ref 3.5–5.1)
Sodium: 126 mmol/L — ABNORMAL LOW (ref 136–145)

## 2013-11-09 LAB — TROPONIN I: Troponin-I: <0.02 ng/mL

## 2013-11-09 LAB — CK-MB: CK-MB: 1.2 ng/mL (ref 0.5–3.6)

## 2013-11-10 LAB — BASIC METABOLIC PANEL
Anion Gap: 3 — ABNORMAL LOW (ref 7–16)
BUN: 16 mg/dL (ref 7–18)
CHLORIDE: 81 mmol/L — AB (ref 98–107)
CREATININE: 0.84 mg/dL (ref 0.60–1.30)
Calcium, Total: 8.1 mg/dL — ABNORMAL LOW (ref 8.5–10.1)
Co2: 44 mmol/L (ref 21–32)
Glucose: 131 mg/dL — ABNORMAL HIGH (ref 65–99)
Osmolality: 260 (ref 275–301)
Potassium: 3.7 mmol/L (ref 3.5–5.1)
Sodium: 128 mmol/L — ABNORMAL LOW (ref 136–145)

## 2013-11-10 LAB — MAGNESIUM: MAGNESIUM: 1.7 mg/dL — AB

## 2013-11-10 LAB — PHOSPHORUS: Phosphorus: 1.9 mg/dL — ABNORMAL LOW (ref 2.5–4.9)

## 2013-11-11 LAB — BASIC METABOLIC PANEL
Anion Gap: 2 — ABNORMAL LOW (ref 7–16)
BUN: 24 mg/dL — AB (ref 7–18)
CO2: 45 mmol/L — AB (ref 21–32)
Calcium, Total: 8.1 mg/dL — ABNORMAL LOW (ref 8.5–10.1)
Chloride: 81 mmol/L — ABNORMAL LOW (ref 98–107)
Creatinine: 0.86 mg/dL (ref 0.60–1.30)
EGFR (Non-African Amer.): >60
GLUCOSE: 115 mg/dL — AB (ref 65–99)
Osmolality: 262 (ref 275–301)
POTASSIUM: 3.6 mmol/L (ref 3.5–5.1)
SODIUM: 128 mmol/L — AB (ref 136–145)

## 2013-11-12 LAB — BASIC METABOLIC PANEL
Anion Gap: 3 — ABNORMAL LOW (ref 7–16)
BUN: 22 mg/dL — AB (ref 7–18)
Calcium, Total: 8.1 mg/dL — ABNORMAL LOW (ref 8.5–10.1)
Chloride: 79 mmol/L — ABNORMAL LOW (ref 98–107)
Co2: 44 mmol/L (ref 21–32)
Creatinine: 0.84 mg/dL (ref 0.60–1.30)
EGFR (African American): >60
GLUCOSE: 135 mg/dL — AB (ref 65–99)
Osmolality: 259 (ref 275–301)
Potassium: 3.7 mmol/L (ref 3.5–5.1)
Sodium: 126 mmol/L — ABNORMAL LOW (ref 136–145)

## 2013-11-13 LAB — BASIC METABOLIC PANEL
Anion Gap: 3 — ABNORMAL LOW (ref 7–16)
BUN: 26 mg/dL — ABNORMAL HIGH (ref 7–18)
CALCIUM: 8.5 mg/dL (ref 8.5–10.1)
CREATININE: 0.89 mg/dL (ref 0.60–1.30)
Chloride: 80 mmol/L — ABNORMAL LOW (ref 98–107)
Co2: 43 mmol/L (ref 21–32)
EGFR (African American): >60
EGFR (Non-African Amer.): >60
Glucose: 94 mg/dL (ref 65–99)
Osmolality: 258 (ref 275–301)
Potassium: 3.7 mmol/L (ref 3.5–5.1)
Sodium: 126 mmol/L — ABNORMAL LOW (ref 136–145)

## 2013-11-13 LAB — OSMOLALITY: OSMOLALITY: 269 mosm/kg — AB (ref 275–295)

## 2013-11-17 ENCOUNTER — Inpatient Hospital Stay: Payer: Self-pay | Admitting: General Practice

## 2013-11-17 LAB — PLATELET COUNT: PLATELETS: 131 10*3/uL — AB (ref 150–440)

## 2013-11-18 LAB — BASIC METABOLIC PANEL
ANION GAP: 7 (ref 7–16)
BUN: 62 mg/dL — ABNORMAL HIGH (ref 7–18)
CALCIUM: 7.8 mg/dL — AB (ref 8.5–10.1)
Chloride: 103 mmol/L (ref 98–107)
Co2: 32 mmol/L (ref 21–32)
Creatinine: 1.08 mg/dL (ref 0.60–1.30)
GLUCOSE: 76 mg/dL (ref 65–99)
OSMOLALITY: 299 (ref 275–301)
POTASSIUM: 4.2 mmol/L (ref 3.5–5.1)
SODIUM: 142 mmol/L (ref 136–145)

## 2013-11-18 LAB — CBC WITH DIFFERENTIAL/PLATELET
Basophil #: 0 10*3/uL (ref 0.0–0.1)
Basophil %: 0.5 %
EOS ABS: 0.1 10*3/uL (ref 0.0–0.7)
Eosinophil %: 1.3 %
HCT: 40.9 % (ref 40.0–52.0)
HGB: 12.9 g/dL — AB (ref 13.0–18.0)
Lymphocyte #: 1.9 10*3/uL (ref 1.0–3.6)
Lymphocyte %: 24.9 %
MCH: 33 pg (ref 26.0–34.0)
MCHC: 31.6 g/dL — ABNORMAL LOW (ref 32.0–36.0)
MCV: 104 fL — ABNORMAL HIGH (ref 80–100)
MONOS PCT: 10.1 %
Monocyte #: 0.8 x10 3/mm (ref 0.2–1.0)
Neutrophil #: 4.8 10*3/uL (ref 1.4–6.5)
Neutrophil %: 63.2 %
Platelet: 128 10*3/uL — ABNORMAL LOW (ref 150–440)
RBC: 3.92 10*6/uL — AB (ref 4.40–5.90)
RDW: 15.3 % — AB (ref 11.5–14.5)
WBC: 7.6 10*3/uL (ref 3.8–10.6)

## 2013-11-18 LAB — PROTIME-INR
INR: 1.1
Prothrombin Time: 13.8 secs (ref 11.5–14.7)

## 2013-11-18 LAB — APTT: Activated PTT: 28.3 secs (ref 23.6–35.9)

## 2013-11-18 LAB — HEPARIN LEVEL (UNFRACTIONATED): Anti-Xa(Unfractionated): 0.16 IU/mL — ABNORMAL LOW (ref 0.30–0.70)

## 2013-11-19 LAB — BASIC METABOLIC PANEL
ANION GAP: 2 — AB (ref 7–16)
BUN: 32 mg/dL — AB (ref 7–18)
CO2: 32 mmol/L (ref 21–32)
Calcium, Total: 8.3 mg/dL — ABNORMAL LOW (ref 8.5–10.1)
Chloride: 105 mmol/L (ref 98–107)
Creatinine: 0.66 mg/dL (ref 0.60–1.30)
EGFR (African American): >60
EGFR (Non-African Amer.): >60
Glucose: 81 mg/dL (ref 65–99)
Osmolality: 283 (ref 275–301)
Potassium: 4.3 mmol/L (ref 3.5–5.1)
Sodium: 139 mmol/L (ref 136–145)

## 2013-11-19 LAB — CBC WITH DIFFERENTIAL/PLATELET
Basophil #: 0 10*3/uL (ref 0.0–0.1)
Basophil %: 0.4 %
Eosinophil #: 0.1 10*3/uL (ref 0.0–0.7)
Eosinophil %: 1.3 %
HCT: 40.4 % (ref 40.0–52.0)
HGB: 12.9 g/dL — ABNORMAL LOW (ref 13.0–18.0)
Lymphocyte #: 1.9 10*3/uL (ref 1.0–3.6)
Lymphocyte %: 27.6 %
MCH: 32.9 pg (ref 26.0–34.0)
MCHC: 31.9 g/dL — ABNORMAL LOW (ref 32.0–36.0)
MCV: 103 fL — ABNORMAL HIGH (ref 80–100)
Monocyte #: 0.8 x10 3/mm (ref 0.2–1.0)
Monocyte %: 11.1 %
Neutrophil #: 4.1 10*3/uL (ref 1.4–6.5)
Neutrophil %: 59.6 %
Platelet: 120 10*3/uL — ABNORMAL LOW (ref 150–440)
RBC: 3.91 10*6/uL — ABNORMAL LOW (ref 4.40–5.90)
RDW: 15.2 % — ABNORMAL HIGH (ref 11.5–14.5)
WBC: 6.8 10*3/uL (ref 3.8–10.6)

## 2013-11-19 LAB — HEPARIN LEVEL (UNFRACTIONATED)
Anti-Xa(Unfractionated): 0.23 IU/mL — ABNORMAL LOW (ref 0.30–0.70)
Anti-Xa(Unfractionated): 0.34 IU/mL (ref 0.30–0.70)
Anti-Xa(Unfractionated): 0.38 IU/mL (ref 0.30–0.70)

## 2013-11-20 LAB — CBC WITH DIFFERENTIAL/PLATELET
Basophil #: 0 10*3/uL (ref 0.0–0.1)
Basophil %: 0.5 %
Eosinophil #: 0.1 10*3/uL (ref 0.0–0.7)
Eosinophil %: 1.3 %
HCT: 39.6 % — ABNORMAL LOW (ref 40.0–52.0)
HGB: 12.6 g/dL — ABNORMAL LOW (ref 13.0–18.0)
Lymphocyte #: 1.6 10*3/uL (ref 1.0–3.6)
Lymphocyte %: 20.8 %
MCH: 32.9 pg (ref 26.0–34.0)
MCHC: 31.7 g/dL — ABNORMAL LOW (ref 32.0–36.0)
MCV: 104 fL — ABNORMAL HIGH (ref 80–100)
Monocyte #: 0.7 x10 3/mm (ref 0.2–1.0)
Monocyte %: 8.8 %
Neutrophil #: 5.4 10*3/uL (ref 1.4–6.5)
Neutrophil %: 68.6 %
Platelet: 119 10*3/uL — ABNORMAL LOW (ref 150–440)
RBC: 3.81 10*6/uL — ABNORMAL LOW (ref 4.40–5.90)
RDW: 15 % — ABNORMAL HIGH (ref 11.5–14.5)
WBC: 7.8 10*3/uL (ref 3.8–10.6)

## 2013-11-20 LAB — HEPARIN LEVEL (UNFRACTIONATED): Anti-Xa(Unfractionated): <0.1 IU/mL — ABNORMAL LOW (ref 0.30–0.70)

## 2013-11-21 LAB — CBC WITH DIFFERENTIAL/PLATELET
Basophil #: 0 10*3/uL (ref 0.0–0.1)
Basophil %: 0.3 %
Eosinophil #: 0.1 10*3/uL (ref 0.0–0.7)
Eosinophil %: 0.6 %
HCT: 38.8 % — ABNORMAL LOW (ref 40.0–52.0)
HGB: 12 g/dL — ABNORMAL LOW (ref 13.0–18.0)
LYMPHS ABS: 1.4 10*3/uL (ref 1.0–3.6)
Lymphocyte %: 12.9 %
MCH: 32.2 pg (ref 26.0–34.0)
MCHC: 31 g/dL — ABNORMAL LOW (ref 32.0–36.0)
MCV: 104 fL — AB (ref 80–100)
MONOS PCT: 7.7 %
Monocyte #: 0.8 x10 3/mm (ref 0.2–1.0)
Neutrophil #: 8.2 10*3/uL — ABNORMAL HIGH (ref 1.4–6.5)
Neutrophil %: 78.5 %
PLATELETS: 108 10*3/uL — AB (ref 150–440)
RBC: 3.73 10*6/uL — AB (ref 4.40–5.90)
RDW: 14.9 % — AB (ref 11.5–14.5)
WBC: 10.5 10*3/uL (ref 3.8–10.6)

## 2013-11-21 LAB — BASIC METABOLIC PANEL
Anion Gap: 4 — ABNORMAL LOW (ref 7–16)
BUN: 20 mg/dL — AB (ref 7–18)
Calcium, Total: 8.3 mg/dL — ABNORMAL LOW (ref 8.5–10.1)
Chloride: 104 mmol/L (ref 98–107)
Co2: 32 mmol/L (ref 21–32)
Creatinine: 0.65 mg/dL (ref 0.60–1.30)
EGFR (Non-African Amer.): >60
GLUCOSE: 100 mg/dL — AB (ref 65–99)
OSMOLALITY: 282 (ref 275–301)
Potassium: 5.1 mmol/L (ref 3.5–5.1)
Sodium: 140 mmol/L (ref 136–145)

## 2013-11-27 ENCOUNTER — Inpatient Hospital Stay: Payer: Self-pay | Admitting: Internal Medicine

## 2013-11-27 LAB — CK TOTAL AND CKMB (NOT AT ARMC)
CK, TOTAL: 46 U/L
CK-MB: 2.2 ng/mL (ref 0.5–3.6)

## 2013-11-27 LAB — AMMONIA: Ammonia, Plasma: 29 mcmol/L (ref 11–32)

## 2013-11-27 LAB — CBC
HCT: 39.9 % — ABNORMAL LOW (ref 40.0–52.0)
HGB: 12.8 g/dL — ABNORMAL LOW (ref 13.0–18.0)
MCH: 32.8 pg (ref 26.0–34.0)
MCHC: 32.2 g/dL (ref 32.0–36.0)
MCV: 102 fL — ABNORMAL HIGH (ref 80–100)
PLATELETS: 126 10*3/uL — AB (ref 150–440)
RBC: 3.91 10*6/uL — ABNORMAL LOW (ref 4.40–5.90)
RDW: 14.9 % — ABNORMAL HIGH (ref 11.5–14.5)
WBC: 6.1 10*3/uL (ref 3.8–10.6)

## 2013-11-27 LAB — COMPREHENSIVE METABOLIC PANEL
ALBUMIN: 3 g/dL — AB (ref 3.4–5.0)
ALK PHOS: 129 U/L — AB
Anion Gap: 3 — ABNORMAL LOW (ref 7–16)
BUN: 11 mg/dL (ref 7–18)
Bilirubin,Total: 0.8 mg/dL (ref 0.2–1.0)
CREATININE: 0.59 mg/dL — AB (ref 0.60–1.30)
Calcium, Total: 8.1 mg/dL — ABNORMAL LOW (ref 8.5–10.1)
Chloride: 87 mmol/L — ABNORMAL LOW (ref 98–107)
Co2: 44 mmol/L (ref 21–32)
EGFR (African American): >60
EGFR (Non-African Amer.): >60
Glucose: 138 mg/dL — ABNORMAL HIGH (ref 65–99)
Osmolality: 270 (ref 275–301)
POTASSIUM: 5 mmol/L (ref 3.5–5.1)
SGOT(AST): 30 U/L (ref 15–37)
SGPT (ALT): 25 U/L
SODIUM: 134 mmol/L — AB (ref 136–145)
Total Protein: 7.2 g/dL (ref 6.4–8.2)

## 2013-11-27 LAB — TROPONIN I: Troponin-I: <0.02 ng/mL

## 2013-11-27 LAB — PRO B NATRIURETIC PEPTIDE: B-TYPE NATIURETIC PEPTID: 3934 pg/mL — AB (ref 0–125)

## 2013-11-28 LAB — BASIC METABOLIC PANEL
Anion Gap: 8 (ref 7–16)
BUN: 11 mg/dL (ref 7–18)
CALCIUM: 8.2 mg/dL — AB (ref 8.5–10.1)
Chloride: 85 mmol/L — ABNORMAL LOW (ref 98–107)
Co2: 43 mmol/L (ref 21–32)
Creatinine: 0.67 mg/dL (ref 0.60–1.30)
EGFR (Non-African Amer.): >60
Glucose: 101 mg/dL — ABNORMAL HIGH (ref 65–99)
Osmolality: 271 (ref 275–301)
Potassium: 4.4 mmol/L (ref 3.5–5.1)
Sodium: 136 mmol/L (ref 136–145)

## 2013-11-28 LAB — CBC WITH DIFFERENTIAL/PLATELET
Basophil #: 0 10*3/uL (ref 0.0–0.1)
Basophil %: 0.3 %
Eosinophil #: 0 10*3/uL (ref 0.0–0.7)
Eosinophil %: 0.2 %
HCT: 38.9 % — ABNORMAL LOW (ref 40.0–52.0)
HGB: 12.5 g/dL — ABNORMAL LOW (ref 13.0–18.0)
LYMPHS ABS: 0.5 10*3/uL — AB (ref 1.0–3.6)
Lymphocyte %: 7.5 %
MCH: 32.5 pg (ref 26.0–34.0)
MCHC: 32 g/dL (ref 32.0–36.0)
MCV: 102 fL — ABNORMAL HIGH (ref 80–100)
MONO ABS: 0.3 x10 3/mm (ref 0.2–1.0)
MONOS PCT: 4.3 %
NEUTROS ABS: 5.8 10*3/uL (ref 1.4–6.5)
NEUTROS PCT: 87.7 %
PLATELETS: 112 10*3/uL — AB (ref 150–440)
RBC: 3.83 10*6/uL — ABNORMAL LOW (ref 4.40–5.90)
RDW: 14.6 % — ABNORMAL HIGH (ref 11.5–14.5)
WBC: 6.7 10*3/uL (ref 3.8–10.6)

## 2013-11-28 LAB — CK TOTAL AND CKMB (NOT AT ARMC)
CK, TOTAL: 28 U/L — AB
CK, Total: 25 U/L — ABNORMAL LOW
CK-MB: 1.6 ng/mL (ref 0.5–3.6)
CK-MB: 1.4 ng/mL (ref 0.5–3.6)

## 2013-11-28 LAB — TROPONIN I
Troponin-I: <0.02 ng/mL
Troponin-I: <0.02 ng/mL

## 2013-11-29 LAB — VANCOMYCIN, TROUGH
Vancomycin, Trough: 36 ug/mL (ref 10–20)
Vancomycin, Trough: 26 ug/mL (ref 10–20)

## 2013-11-29 LAB — CREATININE, SERUM
CREATININE: 0.92 mg/dL (ref 0.60–1.30)
EGFR (African American): >60
EGFR (Non-African Amer.): >60

## 2013-11-29 LAB — MAGNESIUM: Magnesium: 1.3 mg/dL — ABNORMAL LOW

## 2013-11-29 LAB — PHOSPHORUS: PHOSPHORUS: 2.8 mg/dL (ref 2.5–4.9)

## 2013-11-30 LAB — BASIC METABOLIC PANEL
Anion Gap: 9 (ref 7–16)
BUN: 24 mg/dL — AB (ref 7–18)
Calcium, Total: 7.6 mg/dL — ABNORMAL LOW (ref 8.5–10.1)
Chloride: 82 mmol/L — ABNORMAL LOW (ref 98–107)
Co2: 44 mmol/L (ref 21–32)
Creatinine: 0.95 mg/dL (ref 0.60–1.30)
EGFR (Non-African Amer.): >60
Glucose: 150 mg/dL — ABNORMAL HIGH (ref 65–99)
OSMOLALITY: 277 (ref 275–301)
Potassium: 3.1 mmol/L — ABNORMAL LOW (ref 3.5–5.1)
SODIUM: 135 mmol/L — AB (ref 136–145)

## 2013-11-30 LAB — EXPECTORATED SPUTUM ASSESSMENT W REFEX TO RESP CULTURE

## 2013-11-30 LAB — MAGNESIUM: MAGNESIUM: 1.8 mg/dL

## 2013-11-30 LAB — PHOSPHORUS: PHOSPHORUS: 4.2 mg/dL (ref 2.5–4.9)

## 2013-12-01 LAB — CBC WITH DIFFERENTIAL/PLATELET
BASOS ABS: 0 10*3/uL (ref 0.0–0.1)
Basophil %: 0.1 %
EOS PCT: 0 %
Eosinophil #: 0 10*3/uL (ref 0.0–0.7)
HCT: 37.7 % — ABNORMAL LOW (ref 40.0–52.0)
HGB: 12 g/dL — AB (ref 13.0–18.0)
Lymphocyte #: 0.5 10*3/uL — ABNORMAL LOW (ref 1.0–3.6)
Lymphocyte %: 7.9 %
MCH: 32 pg (ref 26.0–34.0)
MCHC: 31.8 g/dL — ABNORMAL LOW (ref 32.0–36.0)
MCV: 100 fL (ref 80–100)
MONOS PCT: 5.5 %
Monocyte #: 0.3 x10 3/mm (ref 0.2–1.0)
NEUTROS PCT: 86.5 %
Neutrophil #: 5.4 10*3/uL (ref 1.4–6.5)
PLATELETS: 134 10*3/uL — AB (ref 150–440)
RBC: 3.76 10*6/uL — ABNORMAL LOW (ref 4.40–5.90)
RDW: 14.7 % — ABNORMAL HIGH (ref 11.5–14.5)
WBC: 6.2 10*3/uL (ref 3.8–10.6)

## 2013-12-01 LAB — BASIC METABOLIC PANEL
Anion Gap: 6 — ABNORMAL LOW (ref 7–16)
BUN: 25 mg/dL — ABNORMAL HIGH (ref 7–18)
CALCIUM: 7.8 mg/dL — AB (ref 8.5–10.1)
CO2: 43 mmol/L — AB (ref 21–32)
CREATININE: 0.84 mg/dL (ref 0.60–1.30)
Chloride: 85 mmol/L — ABNORMAL LOW (ref 98–107)
EGFR (Non-African Amer.): >60
GLUCOSE: 157 mg/dL — AB (ref 65–99)
Osmolality: 276 (ref 275–301)
Potassium: 2.9 mmol/L — ABNORMAL LOW (ref 3.5–5.1)
Sodium: 134 mmol/L — ABNORMAL LOW (ref 136–145)

## 2013-12-01 LAB — POTASSIUM: Potassium: 2.9 mmol/L — ABNORMAL LOW (ref 3.5–5.1)

## 2013-12-01 LAB — MAGNESIUM: MAGNESIUM: 2 mg/dL

## 2013-12-02 LAB — CULTURE, BLOOD (SINGLE)

## 2013-12-02 LAB — PHOSPHORUS: Phosphorus: 3.6 mg/dL (ref 2.5–4.9)

## 2013-12-02 LAB — POTASSIUM
Potassium: 3.1 mmol/L — ABNORMAL LOW (ref 3.5–5.1)
Potassium: 3.7 mmol/L (ref 3.5–5.1)

## 2013-12-02 LAB — VANCOMYCIN, TROUGH: VANCOMYCIN, TROUGH: 14 ug/mL (ref 10–20)

## 2013-12-02 LAB — MAGNESIUM: Magnesium: 1.7 mg/dL — ABNORMAL LOW

## 2013-12-03 LAB — CBC WITH DIFFERENTIAL/PLATELET
BASOS ABS: 0 10*3/uL (ref 0.0–0.1)
Basophil %: 0.2 %
EOS PCT: 0 %
Eosinophil #: 0 10*3/uL (ref 0.0–0.7)
HCT: 38.2 % — AB (ref 40.0–52.0)
HGB: 12.3 g/dL — ABNORMAL LOW (ref 13.0–18.0)
LYMPHS ABS: 0.4 10*3/uL — AB (ref 1.0–3.6)
LYMPHS PCT: 6 %
MCH: 32.1 pg (ref 26.0–34.0)
MCHC: 32.3 g/dL (ref 32.0–36.0)
MCV: 100 fL (ref 80–100)
Monocyte #: 0.5 x10 3/mm (ref 0.2–1.0)
Monocyte %: 7.8 %
NEUTROS ABS: 5.2 10*3/uL (ref 1.4–6.5)
NEUTROS PCT: 86 %
PLATELETS: 143 10*3/uL — AB (ref 150–440)
RBC: 3.84 10*6/uL — AB (ref 4.40–5.90)
RDW: 14.7 % — AB (ref 11.5–14.5)
WBC: 6 10*3/uL (ref 3.8–10.6)

## 2013-12-03 LAB — BASIC METABOLIC PANEL
Anion Gap: 7 (ref 7–16)
BUN: 34 mg/dL — ABNORMAL HIGH (ref 7–18)
CREATININE: 0.7 mg/dL (ref 0.60–1.30)
Calcium, Total: 8.1 mg/dL — ABNORMAL LOW (ref 8.5–10.1)
Chloride: 94 mmol/L — ABNORMAL LOW (ref 98–107)
Co2: 38 mmol/L — ABNORMAL HIGH (ref 21–32)
EGFR (Non-African Amer.): >60
Glucose: 122 mg/dL — ABNORMAL HIGH (ref 65–99)
Osmolality: 286 (ref 275–301)
Potassium: 3.4 mmol/L — ABNORMAL LOW (ref 3.5–5.1)
Sodium: 139 mmol/L (ref 136–145)

## 2013-12-03 LAB — MAGNESIUM: Magnesium: 2.1 mg/dL

## 2013-12-04 LAB — BASIC METABOLIC PANEL
Anion Gap: 5 — ABNORMAL LOW (ref 7–16)
BUN: 37 mg/dL — ABNORMAL HIGH (ref 7–18)
CALCIUM: 8.7 mg/dL (ref 8.5–10.1)
CHLORIDE: 96 mmol/L — AB (ref 98–107)
CO2: 37 mmol/L — AB (ref 21–32)
CREATININE: 0.81 mg/dL (ref 0.60–1.30)
EGFR (African American): >60
Glucose: 146 mg/dL — ABNORMAL HIGH (ref 65–99)
OSMOLALITY: 287 (ref 275–301)
Potassium: 3.4 mmol/L — ABNORMAL LOW (ref 3.5–5.1)
SODIUM: 138 mmol/L (ref 136–145)

## 2013-12-04 LAB — MAGNESIUM: MAGNESIUM: 1.7 mg/dL — AB

## 2013-12-05 LAB — BASIC METABOLIC PANEL
Anion Gap: 9 (ref 7–16)
BUN: 41 mg/dL — ABNORMAL HIGH (ref 7–18)
CALCIUM: 8.7 mg/dL (ref 8.5–10.1)
CHLORIDE: 97 mmol/L — AB (ref 98–107)
Co2: 36 mmol/L — ABNORMAL HIGH (ref 21–32)
Creatinine: 0.75 mg/dL (ref 0.60–1.30)
EGFR (African American): >60
Glucose: 146 mg/dL — ABNORMAL HIGH (ref 65–99)
OSMOLALITY: 296 (ref 275–301)
Potassium: 3.1 mmol/L — ABNORMAL LOW (ref 3.5–5.1)
Sodium: 142 mmol/L (ref 136–145)

## 2013-12-05 LAB — MAGNESIUM: Magnesium: 2 mg/dL

## 2013-12-10 ENCOUNTER — Ambulatory Visit: Payer: Self-pay | Admitting: Internal Medicine

## 2014-02-01 IMAGING — CR DG CHEST 1V
1 series · 1 of 1 positions shown · non-contrast
Comparison: none

REASON FOR EXAM: sob
COMMENTS:

PROCEDURE:     DXR - DXR CHEST 1 VIEWAP OR PA  - November 22, 2011  [DATE]
RESULT:     Comparison: 11/21/2011

[ap]
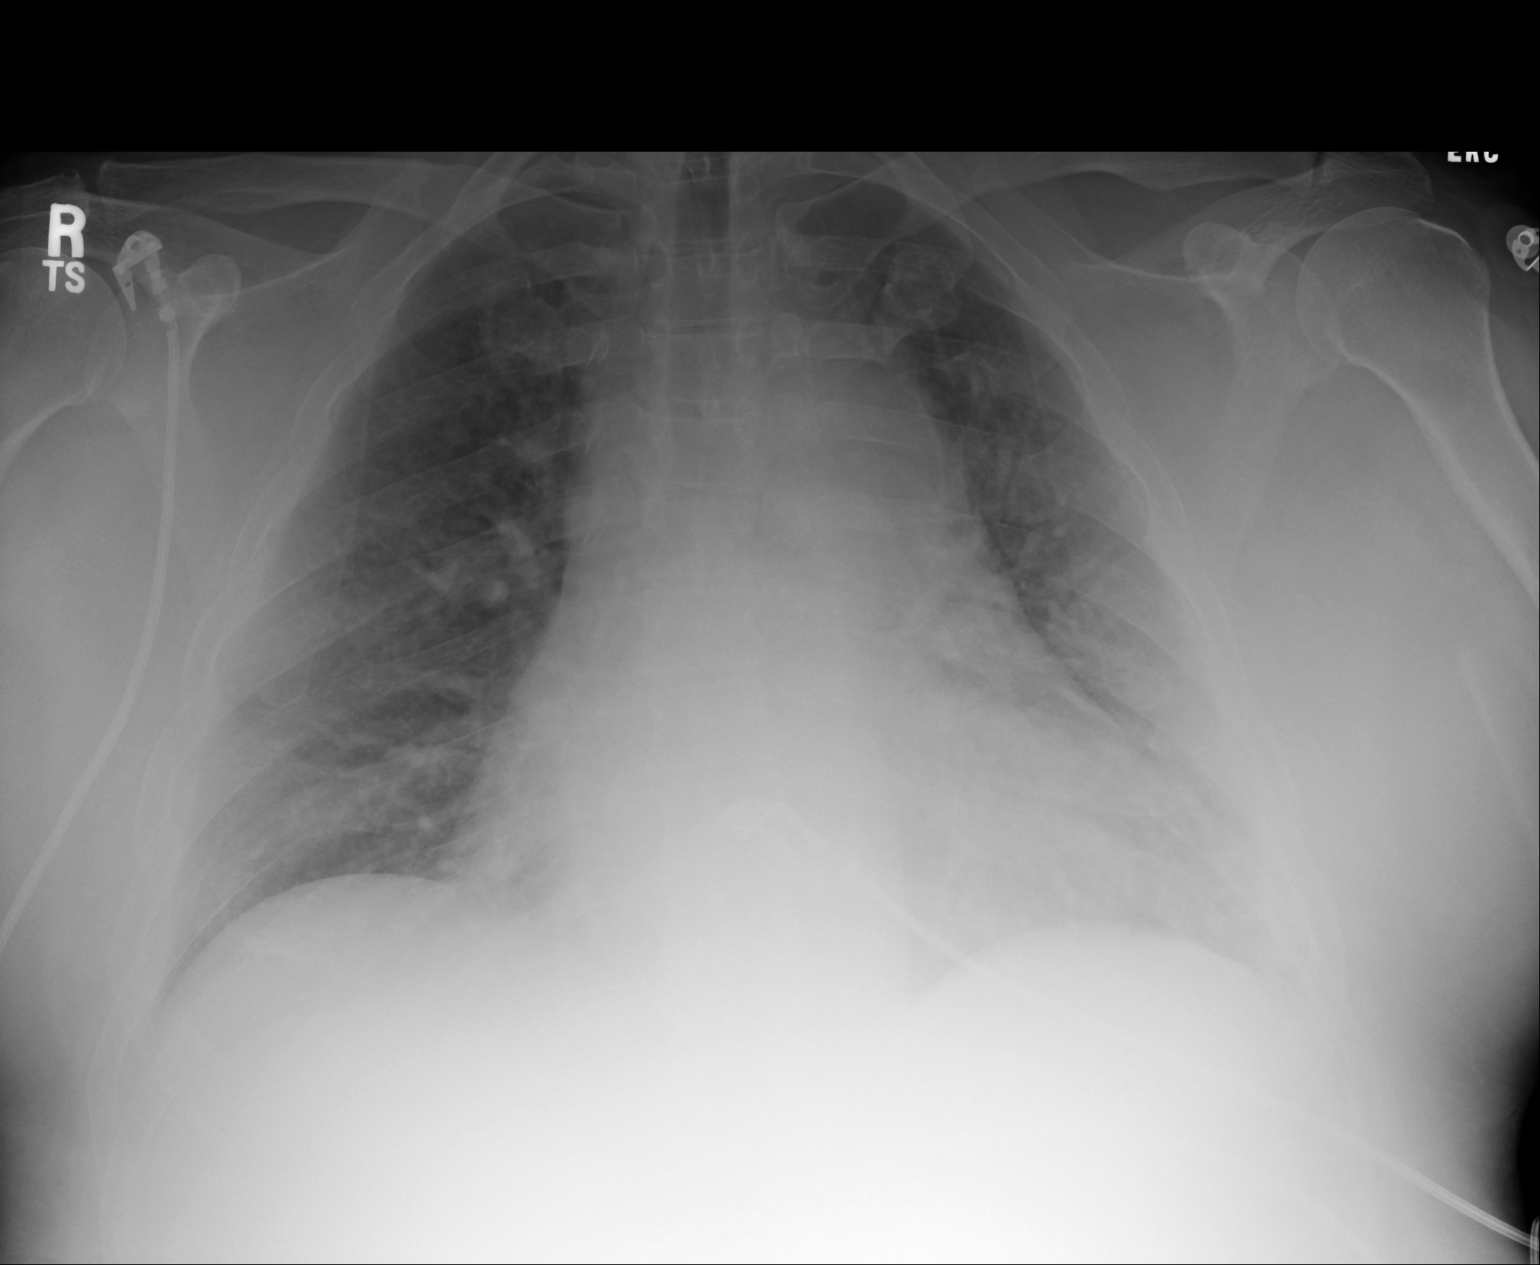

[1 of 1 positions shown; findings below may reference images not displayed]

FINDINGS: Cardiomegaly and widened mediastinum are similar to prior. Bilateral
interstitial opacities are similar to prior. Anterior spinal fusion hardware
is incompletely visualized.
IMPRESSION: Findings suggesting interstitial edema are similar to prior.

## 2014-02-03 IMAGING — CR DG CHEST 1V
1 series · 1 of 1 positions shown · non-contrast
Comparison: none

REASON FOR EXAM: sob
COMMENTS:

[ap]
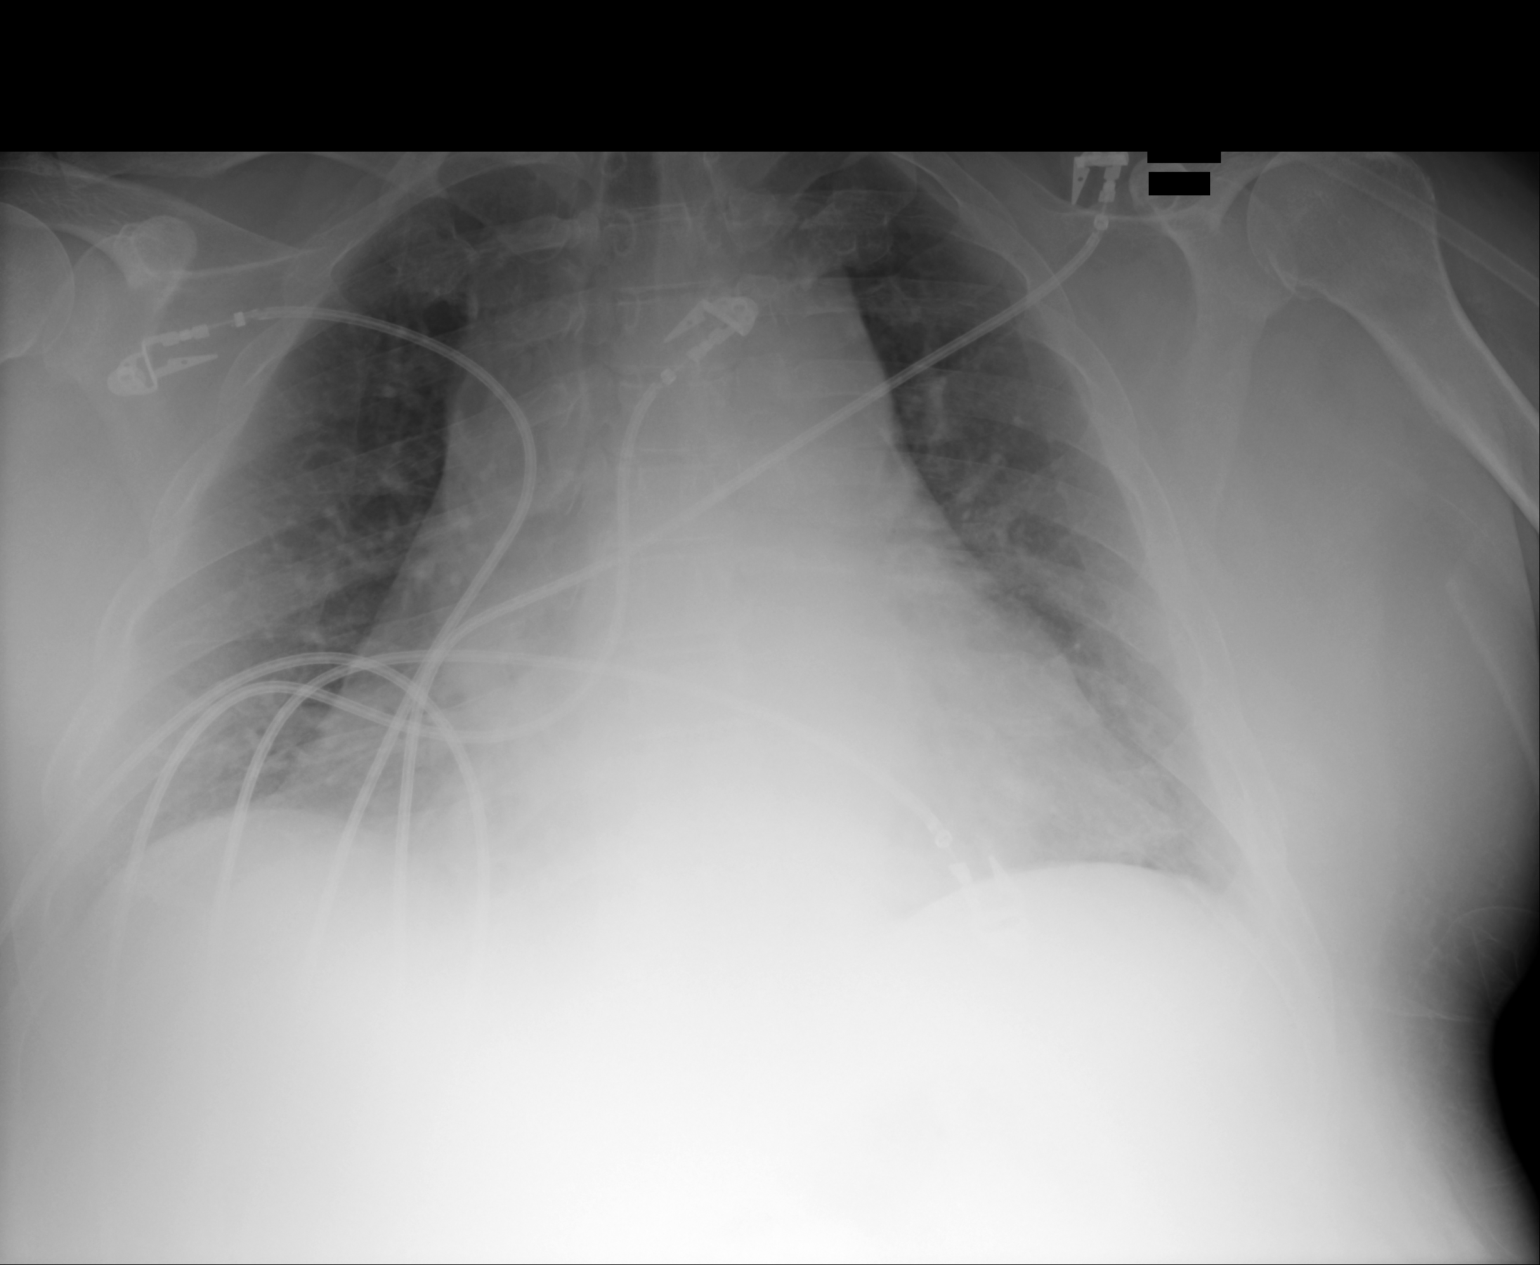

[1 of 1 positions shown; findings below may reference images not displayed]

PROCEDURE:     DXR - DXR CHEST 1 VIEWAP OR PA  - November 24, 2011 [DATE]

RESULT:     Comparison is made to the study 23 November, 2011 and
demonstrates persistent cardiomegaly. Only vascular congestion without
infiltrate, effusion or pneumothorax is present. Monitoring electrodes
remain in place. Mediastinal widening is unchanged.
IMPRESSION: Stable cardiomegaly. No acute cardiopulmonary disease or
significant change.

[REDACTED]

## 2014-02-04 IMAGING — US US GUIDE NEEDLE - US PARA
1 series · 6 of 6 positions shown · non-contrast
Comparison: none

REASON FOR EXAM: SOB due t ascitis with hypoxemia
COMMENTS:

PROCEDURE:     US  - US GUIDED PARACENTESIS  - November 25, 2011 [DATE]
RESULT:     Comparison: None.
TECHNIQUE: Limited ultrasound images were obtained of the 4 quadrants of the
abdomen to evaluate for ascites.

[Series 1: us guide needle - us para · 0.28mm/px · 6 of 6 slices shown]
[im 1/6]
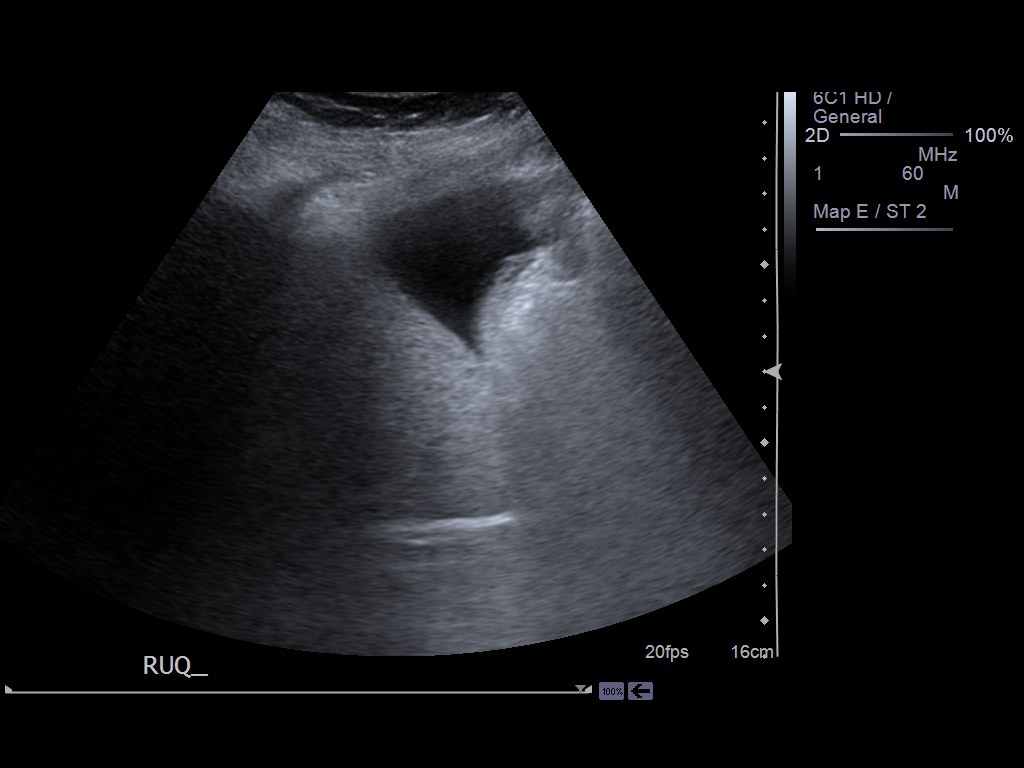
[im 2/6]
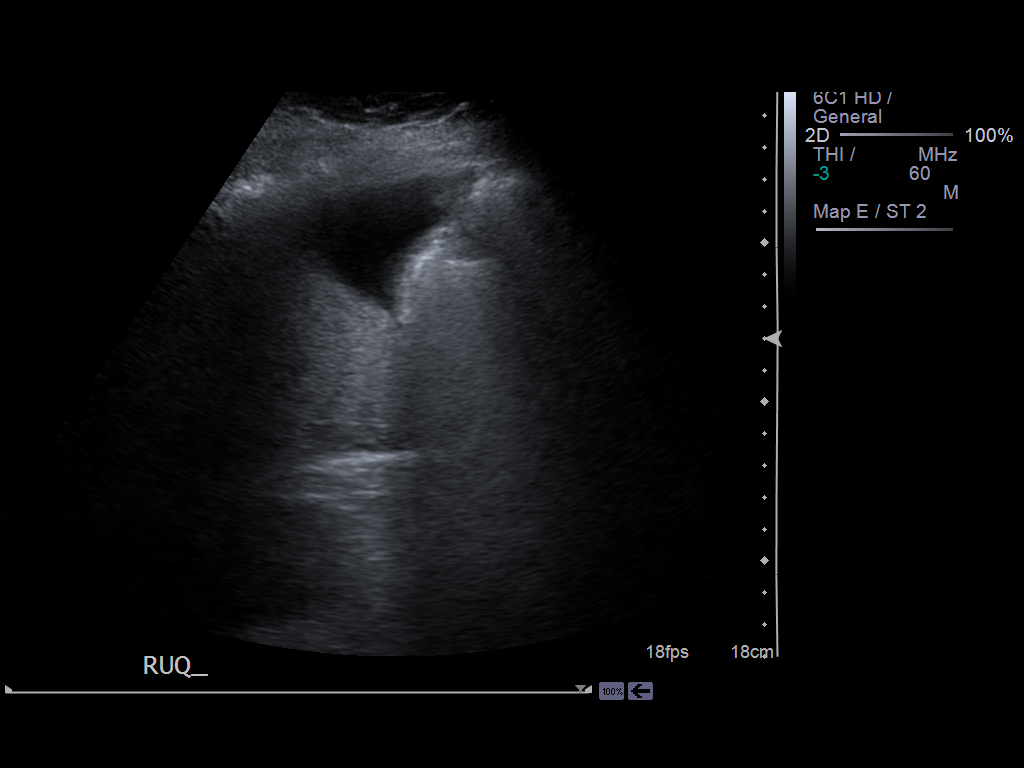
[im 3/6]
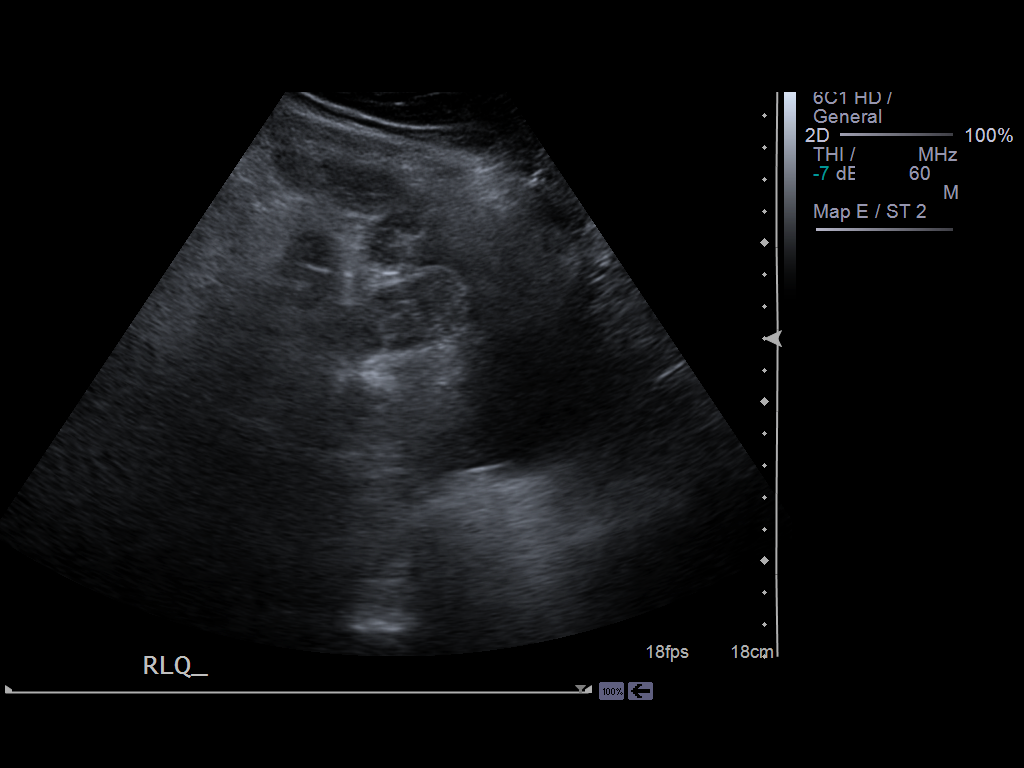
[im 4/6]
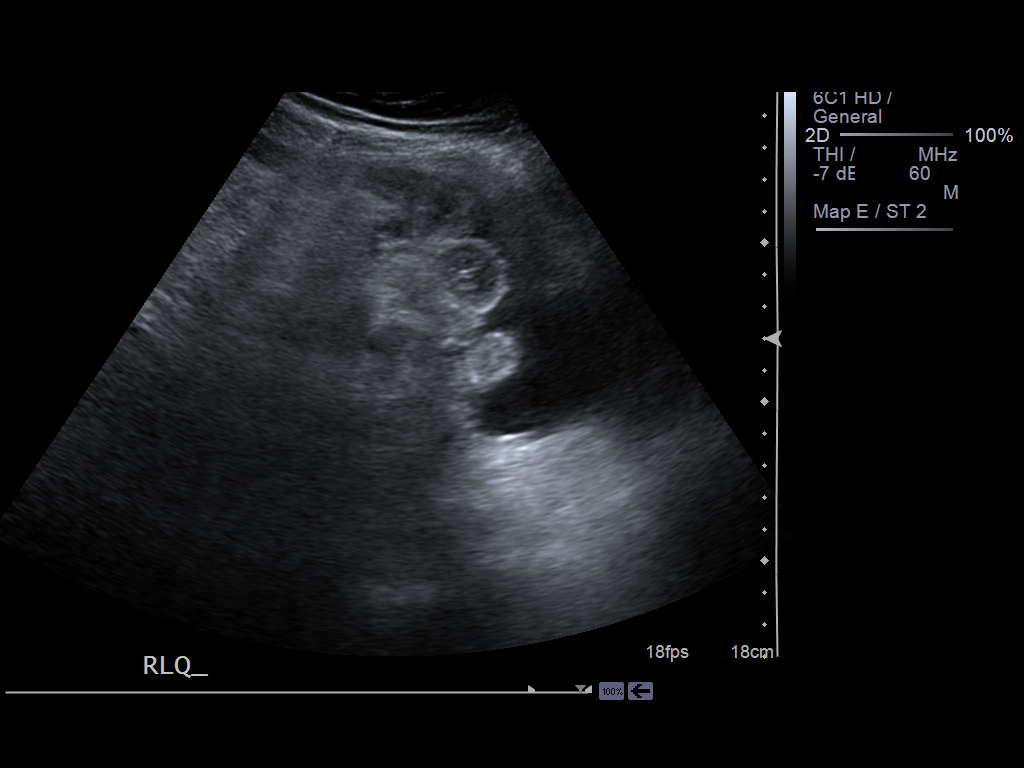
[im 5/6]
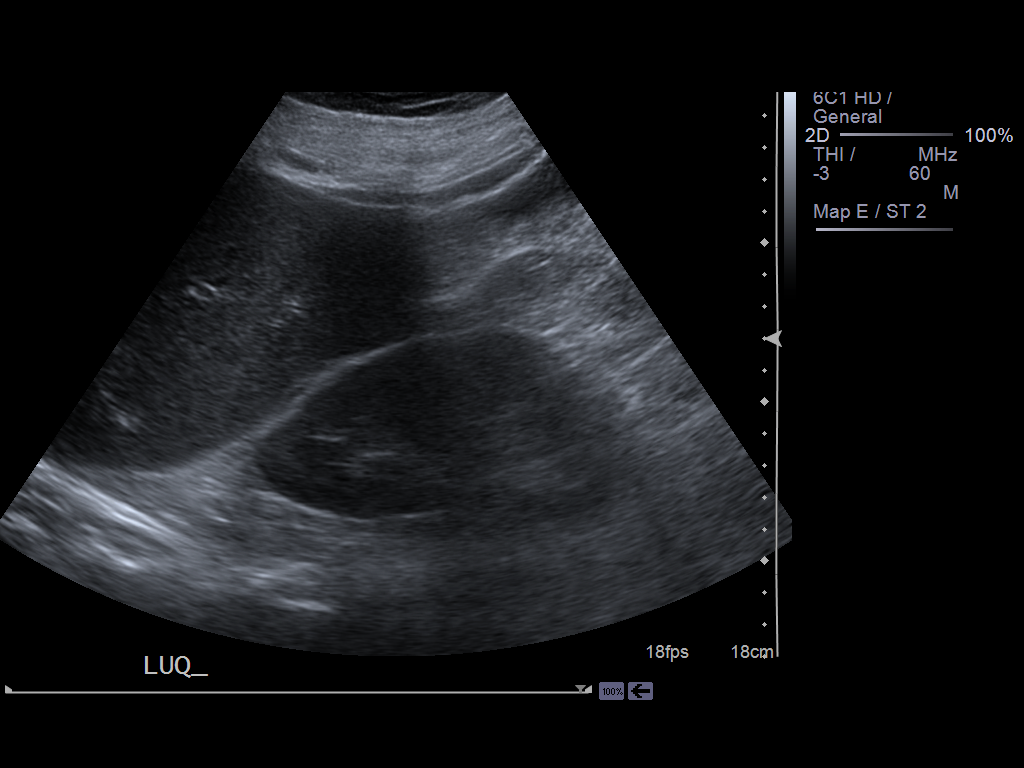
[im 6/6]
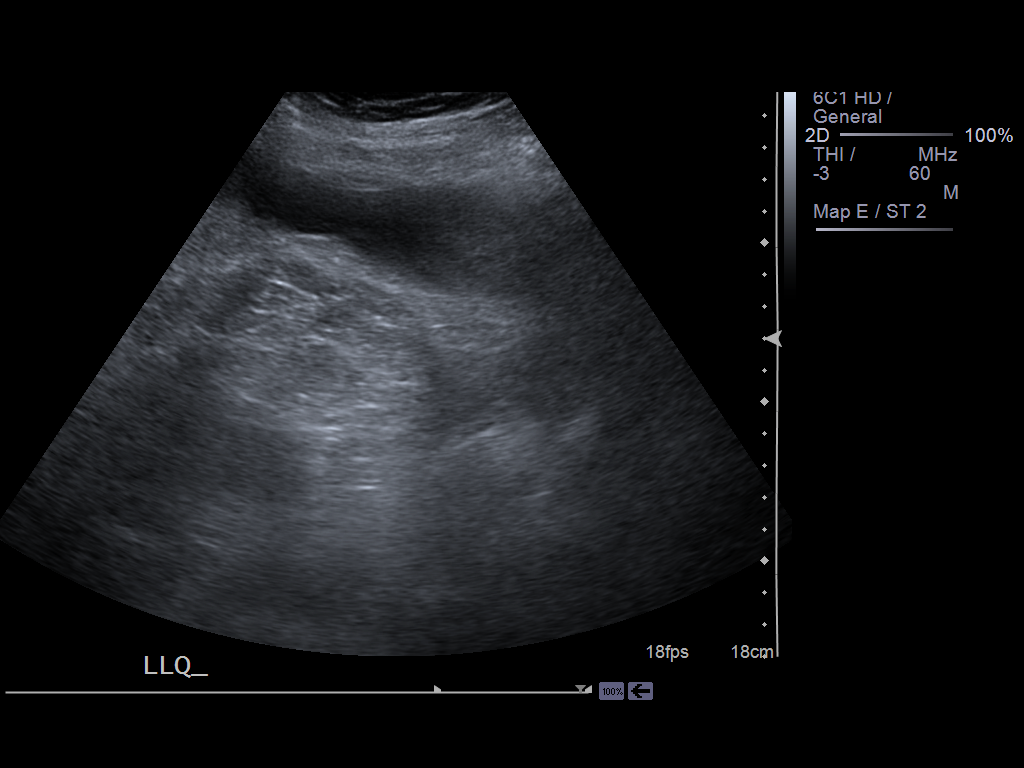

[6 of 6 positions shown; findings below may reference images not displayed]

FINDINGS: There is only a very small amount of fluid adjacent to the liver in the
right upper quadrant. There is only a small and of fluid seen within the
right lower quadrant. There is not enough ascites for an window amenable to
paracentesis.
IMPRESSION: Paracentesis was not performed. Only a small amount of ascites is present,
without a window amenable to paracentesis.

## 2014-02-19 IMAGING — CR DG CHEST 1V PORT
1 series · 1 of 1 positions shown · non-contrast
Comparison: none

REASON FOR EXAM: dizziness
COMMENTS:

[ap]
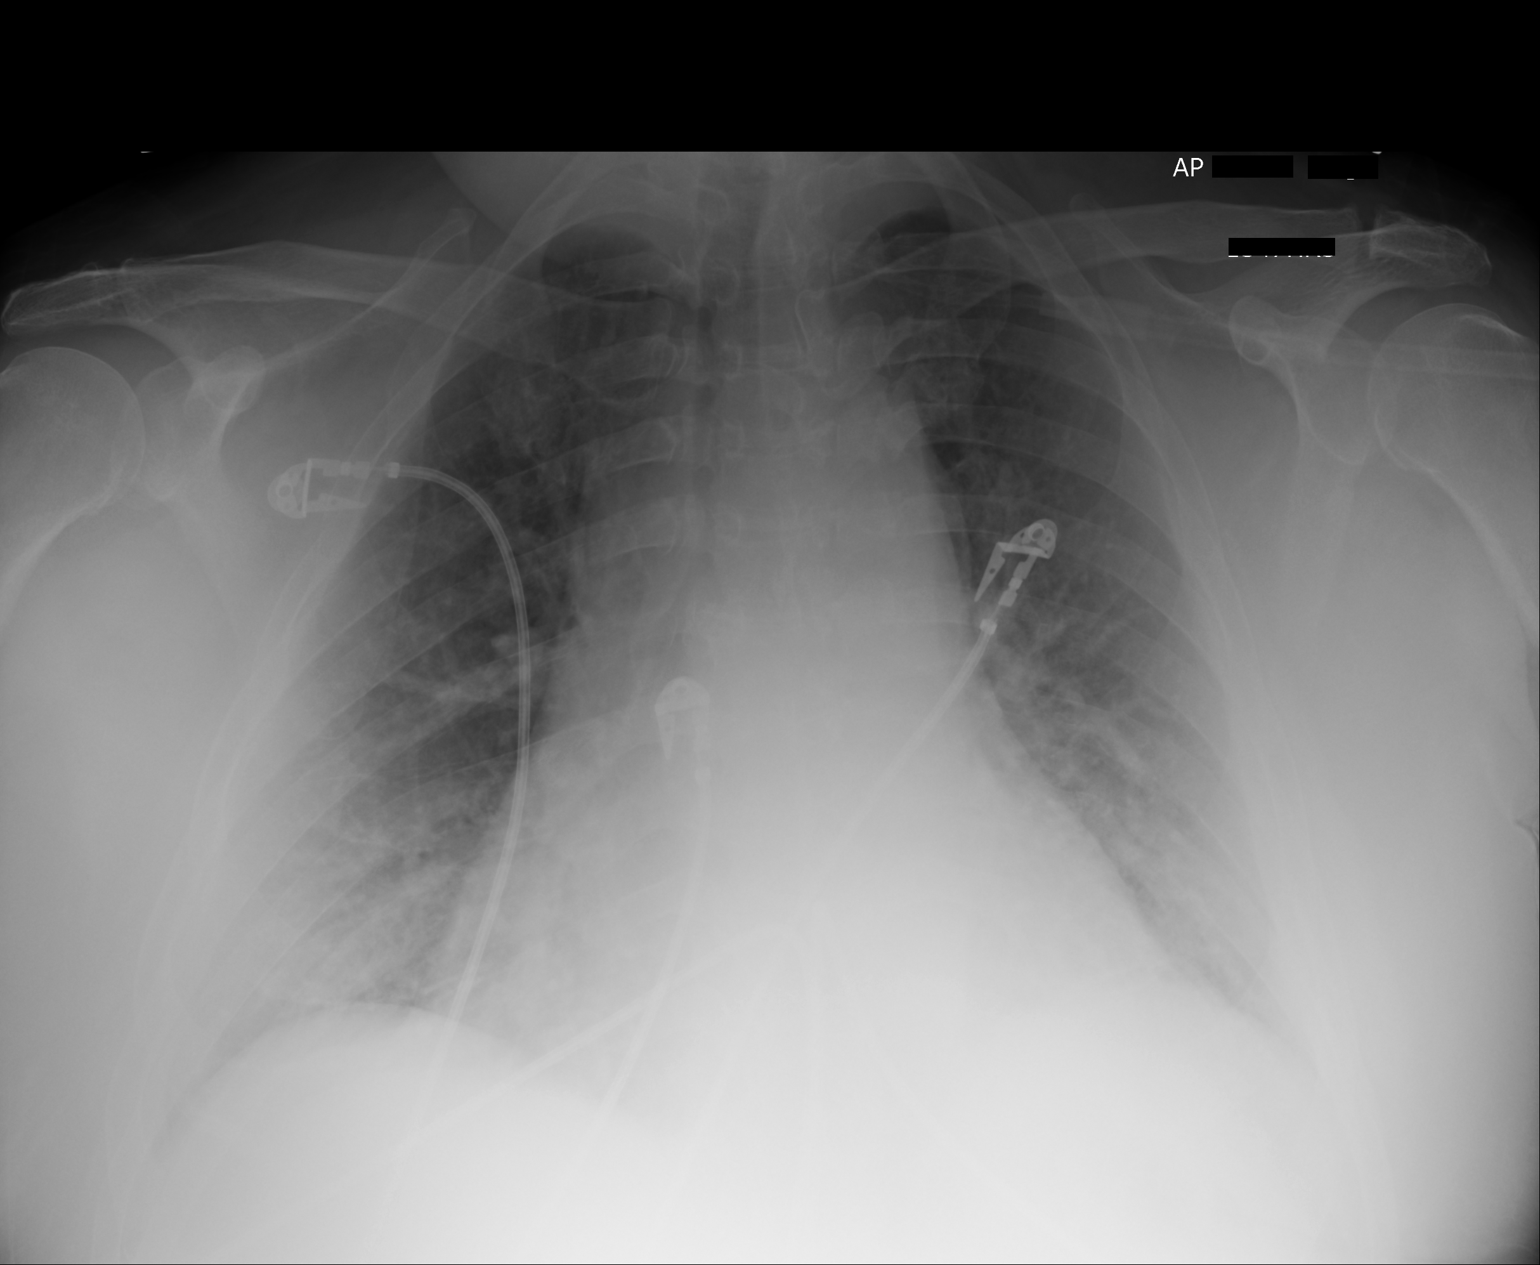

[1 of 1 positions shown; findings below may reference images not displayed]

PROCEDURE:     DXR - DXR PORTABLE CHEST SINGLE VIEW  - December 10, 2011  [DATE]

RESULT:     Comparison is made to the study from 24 November, 2011.

The cardiac silhouette is enlarged. Orthopedic hardware from previous
inferior anterior cervical fusion is present. Pulmonary vascular congestion
possibly with mild edema is present. There is no significant effusion.
Followup PA and lateral views would be helpful if possible.
IMPRESSION: 1. Cardiomegaly with pulmonary vascular congestion and mild edema.

[REDACTED]

## 2014-05-29 NOTE — Consult Note (Signed)
Chief Complaint:   Subjective/Chief Complaint Feels better. No melena. Still shaky but other complaints.   VITAL SIGNS/ANCILLARY NOTES: **Vital Signs.:   16-Oct-13 08:00   Vital Signs Type Routine   Temperature Temperature (F) 98.6   Celsius 37   Temperature Source oral   Pulse Pulse 109   Respirations Respirations 20   Systolic BP Systolic BP 976   Diastolic BP (mmHg) Diastolic BP (mmHg) 734   Mean BP 118   Pulse Ox % Pulse Ox % 97   Pulse Ox Activity Level  At rest   Oxygen Delivery 2L; Nasal Cannula   Brief Assessment:   Additional Physical Exam Abdomen is soft and benign.   Lab Results: Hepatic:  16-Oct-13 04:23    Bilirubin, Total  5.5   Alkaline Phosphatase  224   SGPT (ALT)  134   SGOT (AST)  164   Total Protein, Serum  6.2   Albumin, Serum  2.1  Routine Chem:  16-Oct-13 04:23    Glucose, Serum 80   BUN 7   Creatinine (comp) 0.71   Sodium, Serum 141   Potassium, Serum 3.9   Chloride, Serum 100   CO2, Serum  36   Calcium (Total), Serum 8.5   Osmolality (calc) 278   eGFR (African American) >60   eGFR (Non-African American) >60 (eGFR values <81m/min/1.73 m2 may be an indication of chronic kidney disease (CKD). Calculated eGFR is useful in patients with stable renal function. The eGFR calculation will not be reliable in acutely ill patients when serum creatinine is changing rapidly. It is not useful in  patients on dialysis. The eGFR calculation may not be applicable to patients at the low and high extremes of body sizes, pregnant women, and vegetarians.)   Anion Gap  5  Routine Hem:  16-Oct-13 04:23    WBC (CBC) 9.4   RBC (CBC)  2.37   Hemoglobin (CBC)  8.5   Hematocrit (CBC)  24.4   Platelet Count (CBC)  126   MCV  103   MCH  35.9   MCHC 35.0   RDW  19.9   Neutrophil % 74.3   Lymphocyte % 16.7   Monocyte % 8.1   Eosinophil % 0.7   Basophil % 0.2   Neutrophil #  7.1   Lymphocyte # 1.6   Monocyte # 0.8   Eosinophil # 0.1   Basophil # 0.0  (Result(s) reported on 25 Nov 2011 at 05:56AM.)   Assessment/Plan:  Assessment/Plan:   Assessment GI bleed, resolved. Clean based duodenal ulcers. ETOH hepatitis, improving. ETOH withdrawl. Hepatic encephalopathy, better.    Plan Will change PPI to PO. Will reduce Prednisone to 30 mg a day and please taper slowly as OP. Continue lactulose as OP. Follow up with me in 3-4 weeks. Will sign off. Please call me if needed. Thanks.   Electronic Signatures: IJill Side(MD)  (Signed 16-Oct-13 10:31)  Authored: Chief Complaint, VITAL SIGNS/ANCILLARY NOTES, Brief Assessment, Lab Results, Assessment/Plan   Last Updated: 16-Oct-13 10:31 by IJill Side(MD)

## 2014-05-29 NOTE — Consult Note (Signed)
Chief Complaint:   Subjective/Chief Complaint EGD showed two clean based duodenal ulcers.   Recommendations: Continue PPI. Soft diet. Will order H. pylori antibody and treat if positive.   Electronic Signatures: Lurline DelIftikhar, Cheria Sadiq (MD)  (Signed 14-Oct-13 15:18)  Authored: Chief Complaint   Last Updated: 14-Oct-13 15:18 by Lurline DelIftikhar, Eleanor Gatliff (MD)

## 2014-05-29 NOTE — Consult Note (Signed)
PATIENT NAME:  [name], [name] MR#:  [id number] DATE OF BIRTH:  1960/05/30  DATE OF CONSULTATION:  12/11/2011  REFERRING PHYSICIAN:   CONSULTING PHYSICIAN:  Ezzard Standing. Bluford Kaufmann, MD  REASON FOR REFERRAL: Anemia, melena.   HISTORY OF PRESENT ILLNESS: The patient is a 54 year old white male who was admitted from 10/11 to 10/18 for upper gastrointestinal bleeding requiring blood transfusion. He also has had some evidence of alcohol withdrawal symptoms and acute alcoholic hepatitis. Dr. Niel Hummer was consulted at that time and did an upper endoscopy which showed evidence of some portal gastropathy and some duodenal ulcers. The patient was discharged to home on PPI twice a day, which is Protonix. Other medications included Prednisone taper and Valium and Lactulose for encephalopathy. Anyway, the patient returns to the hospital last night with melena for the last two days with some abdominal pain and increasing shortness of breath and weakness. The patient was found to be anemic with hemoglobin only of 7 and heme positive stools. Therefore, the patient was readmitted for further evaluation.   The patient insisted that he has been taking all of his home medications. He denied taking any aspirin or NSAIDs since discharge. He denied drinking anymore alcohol since discharge as well. He was supposed to followup with Dr. Niel Hummer in the office but has not had a chance to.   PAST MEDICAL HISTORY:  1. Duodenal ulcer. 2. Alcohol abuse.  3. Tobacco use.  4. History of hepatitis B in the past. 5. Hypertension. 6. Congestive heart failure.   ALLERGIES: None.   MEDICATIONS: 1. B12. 2. Zoloft. 3. Coreg.  4. Other discharge medications included Lasix daily, Combivent inhaler, Zoloft and Valium as well as Lactulose.   SOCIAL HISTORY: He smokes and has not drunk any alcohol since.   FAMILY HISTORY: Notable for liver cancer and lung cancer.   ALLERGIES: No allergies.   REVIEW OF SYMPTOMS: Same as the one that was  dictated last night. Please refer to that.   PHYSICAL EXAMINATION:  GENERAL: The patient is in no acute distress.   VITAL SIGNS: He is afebrile. Vital signs are stable.   GENERAL: He is obese. He looks comfortable but is on oxygen,   HEENT: Normocephalic, atraumatic head. Pupils are equally reactive. Throat was clear.   NECK: Supple.   CARDIAC: Regular rhythm and rate without murmurs.   LUNGS: Some crackles at the bases.   ABDOMEN: Normoactive bowel sounds, soft. There is some mild tenderness in the epigastrium. I did not appreciate any obvious hepatomegaly today. The patient had active bowel sounds.   EXTREMITIES: Some pedal edema.   LABORATORY, DIAGNOSTIC, AND RADIOLOGICAL DATA: Last night, sodium 143, potassium 4.3. Albumin is low at 2.5. Bilirubin is 1.1. White count 6.9, hemoglobin was only 7.1, platelet count 189. INR was normal. A chest x-ray shows some pulmonary edema.   ASSESSMENT AND PLAN: This is a patient with melena and some rectal bleeding who may have either bleeding from duodenal ulcers or portal gastropathy. The patient is n.p.o. so we will plan on doing an endoscopy later this morning. If he shows portal gastropathy, he may need either a nonselective beta blocker or short-term octreotide infusion. Liver enzymes are okay. We need to make sure he does not develop any DTs. We will need to give him some Lasix to get rid of some of the extra fluid in his lungs and his legs.   Thank you for the referral.   ____________________________ Ezzard Standing. Bluford Kaufmann, MD pyo:ap D: 12/11/2011 11:44:47 ET  T: 12/11/2011 12:09:19 ET JOB#: [id number]  cc: Ezzard StandingPaul Y. Bluford Kaufmannh, MD, <Dictator>  Ezzard StandingPAUL Y Adeyemi Hamad MD ELECTRONICALLY SIGNED 12/14/2011 8:34

## 2014-05-29 NOTE — Consult Note (Signed)
PATIENT NAME:  [name], [name] MR#:  [id number] DATE OF BIRTH:  April 08, 1960  DATE OF CONSULTATION:  12/23/2011  REFERRING PHYSICIAN:  Erin FullingKurian Kasa, MD  CONSULTING PHYSICIAN:  Ollen GrossPaul S. Willeen CassBennett, MD  REASON FOR CONSULTATION: Possible tracheostomy.   HISTORY OF PRESENTING ILLNESS: This is a 54 year old morbidly obese male with a past history of diastolic congestive heart failure, duodenal ulcers, and recent admission to the hospital for GI bleeding. He was readmitted with progressive shortness of breath and subsequently placed on BiPAP. Apparently he has a history of chronic respiratory failure with COPD. It is unclear whether he's ever had a sleep study or definitive diagnosis of obstructive sleep apnea but he is morbidly obese. The patient himself is obtunded this afternoon and I was not able to get any meaningful history from him.   PAST MEDICAL HISTORY:  1. History of alcohol abuse. 2. Tobacco abuse. 3. Gastroesophageal reflux. 4. Hepatitis B. 5. Diverticulosis. 6. Chronic anemia. 7. Hypertension. 8. Diastolic congestive heart failure.   PAST SURGICAL HISTORY: Surgery on the left side of his neck. It is unclear whether this was on the cervical spine or possible thyroid surgery. The patient is not able to provide history. He had a recent EGD by Dr. Bluford Kaufmannh.   ALLERGIES: No known drug allergies.   MEDICATIONS:  1. Tylenol 650 mg p.o. q.4 hours p.r.n. pain or temperature.  2. Coreg 6.25 mg p.o. b.i.d.  3. Cyanocobalamin tablet 1000 mg p.o. daily.  4. Ferrous sulfate 325 mg p.o. b.i.d. with meals.  5. Folate 1 mg p.o. daily.  6. Humibid LA 600 mg p.o. b.i.d..  7. Magnesium oxide 400 mg p.o. daily.  8. Solu-Medrol 60 mg IV q.6 hours.  9. Nicotine oral inhaler q.1 hour p.r.n.  10. Habitrol patch 14 mg transdermally daily.  11. Nitroglycerin patch 0.4 mg topically p.r.n. daily for hypertension.  12. Zofran 4 mg IV q.4 hours p.r.n. nausea, vomiting. 13. Pantoprazole 40 mg p.o. b.i.d.   14. Phenergan 12.5 mg q.4 hours p.r.n. nausea, vomiting.  15. Zoloft 25 mg p.o. daily.  16. Morphine 2 mg IV q.4 hours p.r.n. dyspnea, pain.  17. Potassium chloride ER 20 mEq p.o. daily. 18. Spiriva 1 capsule inhaled daily.  19. Diamox 250 mg p.o. q.i.d.  20. Zestril 10 mg p.o. daily.  21. Hydralazine 10 mg IV push q.6 hours p.r.n. hypertension.  22. Sliding scale insulin.  23. Alprazolam 0.25 mg p.o. q.8 hours p.r.n. anxiety.   SOCIAL HISTORY: The patient lives with his partner at home. Smokes a half pack of cigarettes a day. He has a prior history of alcohol use drinking up to five 24-ounce cans of beer daily, although he quit prior to discharge.   REVIEW OF SYSTEMS: Currently unobtainable.   PHYSICAL EXAMINATION:   VITAL SIGNS: Temperature 98.5, pulse 78, respirations 17, blood pressure 129/82, sats 96%.   GENERAL: Morbidly obese male on BiPAP therapy. He is awake but seems somewhat obtunded. I am able to ask him questions but he is not providing clear answers.   HEAD AND FACE: Head is normocephalic, atraumatic. No facial skin lesions.   EARS: External ears, ear canals, tympanic membranes are clear bilaterally. There is no middle ear effusion or infection.   NOSE: External nose unremarkable. Nasal cavity is clear. No purulence or polyps are seen.   ORAL CAVITY AND OROPHARYNX: Teeth, lips, and gums unremarkable. Tongue and floor of mouth without lesions. Posterior pharynx reveals soft tissue redundancy of the palate and uvula without exudate. There  is no swelling.   NECK: Neck is supple without adenopathy or mass. Trachea is in the midline. I don't appreciate any thyromegaly. There is a scar just to the left of midline in the lower neck. Salivary glands are soft and nontender without masses.   NEUROLOGIC: Grossly intact.   ASSESSMENT: This patient has chronic respiratory failure and likely has significant sleep apnea, although there is no recent sleep study available and it is  unclear whether he has actually had a sleep study previously from his history. He is basically having an acute exacerbation of chronic respiratory failure.   PLAN: This patient may ultimately need to be intubated. We can look at scheduling him for an elective tracheostomy. Given his morbid obesity, tracheostomy is going to be more difficult and he will be best served if we schedule this with two surgeons available. I've talked with Dr. Andee Poles and we can do this next Monday morning. The patient may need intubation if his respiratory status cannot be maintained on BiPAP in the interim. We would most likely want him intubated for the tracheostomy as doing an awake tracheostomy on this patient with his body habitus and short neck would likely be quite difficult.  ____________________________ Ollen Gross. Willeen Cass, MD psb:drc D: 12/23/2011 18:12:56 ET T: 12/24/2011 09:36:35 ET JOB#: [id number]  cc: Ollen Gross. Willeen Cass, MD, <Dictator> Sandi Mealy MD ELECTRONICALLY SIGNED 12/30/2011 10:43

## 2014-05-29 NOTE — Discharge Summary (Signed)
PATIENT NAME:  [name], [name] MR#:  [id number] DATE OF BIRTH:  August 01, 1960  DATE OF ADMISSION:  12/17/2011 DATE OF DISCHARGE:  12/28/2011  NOTE:  This is the final Discharge Summary. Please review Interim Discharge Summary dictated by Dr. Imogene Burnhen on 12/23/2011.   DISCHARGE DIAGNOSES:   1. Acute on chronic hypoxic hypercarbic respiratory failure.  2. Chronic obstructive pulmonary disease exacerbation.  3. Ongoing tobacco abuse.  4. Suspected pneumonia.  5. Congestive heart failure, diastolic, acute on chronic.  6. Chronic anemia.  7. Hypertension.   CODE STATUS:  FULL CODE.     DISCHARGE MEDICATIONS:  1. Lasix 20 mg daily.  2. Cyanocobalamin 1000 mcg p.o. daily.  3. Combivent inhaler 2 puffs every six hours as needed.  4. Zoloft 25 mg daily.  5. Lactulose 30 mL b.i.d.  6. Magnesium oxide 400 mg daily.  7. Lisinopril 5 mg daily.  8. Coreg 6.25 b.i.d.  9. Folic acid 1 mg p.o. daily.  10. Ferrous sulfate 325 p.o. t.i.d.  11. Prednisone taper.  12. Protonix 40 mg daily.  13. Spiriva 18 mcg inhalation daily.   HOME OXYGEN: 3 liters nasal cannula continuous.    FOLLOWUP: Follow up with Vita [facility]Sarah [facility] at [facility] on Tuesday, November 26th, at 2:10 p.m.   CONSULTATION: Pulmonary consultation with Dr. Belia HemanKasa.   DISCHARGE LABORATORY, DIAGNOSTIC AND RADIOLOGICAL DATA:  White count is 6.9, hemoglobin and hematocrit is 11.3 and 34.9, platelet count is 131. Basic metabolic panel within normal limits except chloride of 108. Chest x-ray showed cardiomegaly. No infiltrate. Magnesium 2.0, phosphorus is 4.5.   BRIEF SUMMARY OF HOSPITAL COURSE: [name] is a 54 year old Caucasian gentleman with a history of chronic obstructive pulmonary disease and ongoing tobacco abuse, is on chronic oxygen at home. He was admitted on November 7th with:   1. Acute on chronic hypoxic respiratory failure: The patient was placed in the Intensive Care Unit, required several days of being on BiPAP and was  weaned off to high-flow nasal cannula. Over the hospital stay, he was weaned down to about 2.5 to 3 liters nasal cannula oxygen. His saturations were well maintained above 92%. He finished a course of Levaquin for possible presumed pneumonia; however, chest x-ray did not show any infiltrates. The patient will finish his outpatient prednisone taper.  2. Congestive heart failure, acute on chronic diastolic: The patient received IV doses of Lasix and once he was well diuresed he was changed back to his p.o. Lasix home dose. His lisinopril and Coreg were continued.   3. Tobacco abuse: Nicotine patch was given. The patient was recommended smoking cessation.  4. Chronic anemia: Hemoglobin remained stable. Iron supplements were continued.   CODE STATUS: The patient remained a FULL CODE.   The patient will follow up with his primary care physician, Vita BarleySarah Williams, at [facility].    TIME SPENT: 40 minutes  ____________________________ Jearl KlinefelterSona A. Allena KatzPatel, MD sap:cbb D: 12/29/2011 07:12:08 ET T: 12/29/2011 10:19:45 ET JOB#: [id number]  cc: Darreld Hoffer A. Allena KatzPatel, MD, <Dictator> [facility], Vita BarleySarah Williams Willow OraSONA A Averie Meiner MD ELECTRONICALLY SIGNED 12/31/2011 20:12

## 2014-05-29 NOTE — Discharge Summary (Signed)
PATIENT NAME:  [name], [name] MR#:  [id number] DATE OF BIRTH:  06-28-1960  DATE OF ADMISSION:  12/10/2011 DATE OF DISCHARGE:  12/13/2011  PRIMARY CARE PHYSICIAN: Dr. Ulanda Edison at [facility]   DISCHARGE DIAGNOSES:  1. Gastrointestinal bleed. 2. Acute on chronic anemia. 3. Chronic obstructive pulmonary disease. 4. Congestive heart failure. 5. Obesity.   HISTORY OF PRESENT ILLNESS/HOSPITAL COURSE: 54 year old Caucasian male with past medical history of congestive heart failure, multiple duodenal ulcers, chronic anemia, hepatitis B, alcohol abuse, tobacco abuse presented to the Emergency Room complaining of bright red blood per rectum and melena sent by primary care physician. Hemoglobin dropped to 7.1 from his baseline 8.5. Primary care physician gave call to Dr. Niel Hummer, gastroenterologist, and he was advised to come to the Emergency Room by him. Is complaining of short of breath and needing 2 liters of oxygen. Chest x-ray shows some congestive heart failure changes. Was admitted with GI bleed and acute on chronic diastolic congestive heart failure. During the hospital stay upper GI endoscopy was done by Dr. Lutricia Feil and the findings are as following: The examination of esophagus was normal, localized mildly erythematous mucosa was found in the gastric antrum. The exam was otherwise without any abnormality. Two superficial duodenal ulcers were found in the first part of the duodenum. Looks like these ulcers are healing and no stigmata of recent bleeding. Impression normal esophagus, erythematous mucosa of the antrum. Examination otherwise normal. Multiple duodenal ulcers. Hemoglobin remained stable at 7. Dr. Mechele Collin saw him over the weekend and he suggested due to his baseline history of diastolic congestive heart failure and chronic obstructive pulmonary disease he was getting tired and short of breath with this hemoglobin and so he suggested that to give 1 unit of blood to bring his hemoglobin  up to 8 and that might help the patient so we transfused him one more unit of the blood and next day he was feeling much better and stronger. Hemoglobin was 8.8 and so he was discharged with that hemoglobin. Advised to follow up with GI.     OTHER ISSUES HANDLED DURING THIS HOSPITAL STAY:  1. Because of his congestive heart failure his last echocardiogram was showing 25% ejection fraction but then it increased up to 55 as per the repeat echocardiogram in the past and so there were some signs of exacerbation of congestive heart failure so he was given Lasix orally and he was discharged with that. So there are no signs of decompensation or exacerbation and so he was discharged with Lasix orally.  2. Alcohol dependence. He was remaining stable during the hospital stay and there was no sign or symptom of withdrawal symptoms.  3. Tobacco abuse. He was advised to do smoking cessation. 4. Deep vein thrombosis prophylaxis was maintained by sequential compression devise. 5. Hyperglycemia. He was continued on insulin sliding scale.   LABORATORY, DIAGNOSTIC AND RADIOLOGICAL DATA: Important lab results during the hospital stay: His hemoglobin on presentation was 7.1 which came up to 8.8 on discharge. Mean corpuscular volume was 107.   CONDITION ON DISCHARGE: Satisfactory.   CODE STATUS: FULL CODE.   MEDICATIONS: Medications to be discharged on:  1. Lasix 20 mg oral tablet once a day.  2. Cyanocobalamin 1000 mcg oral tablet once a day. 3. Combivent 18 mcg 103 mcg inhaler 2 puffs every six hours as needed. 4. Zoloft 25 mg oral tablet once a day. 5. Lactulose 10 grams/15 mL syrup, 30 mL two times a day.  6. Magnesium oxide  400 mg once a day. 7. Prednisone 10 mg oral tablet, start at 60 and taper 10 mg daily until complete. 8. Coreg 12.5 mg 0.5 mg 2 times a day.  9. Pantoprazole 40 mg delayed-release tablet two times a day for 30 days.  10. Lisinopril 5 mg once a day.  11. Folic acid 1 mg oral tablet  once a day.  12. Ferrous sulfate 325 mg 3 times a day for 30 days.   OXYGEN DELIVERY AT HOME: 2 liters nasal cannula as patient was already using that oxygen at home for his chronic obstructive pulmonary disease.   ACTIVITY LIMITATION: As tolerated.   FOLLOW UP: Follow up in GI clinic in 2-3 weeks for testing stool for presence of blood. Return to work after follow up with M.D. and he was advised to follow up with Dr. Niel HummerIftikhar or Dr. Bluford Kaufmannh for GI in 2 to 3 weeks.   TOTAL TIME SPENT IN DISCHARGE: 45 minutes.  ____________________________ Hope PigeonVaibhavkumar G. Elisabeth PigeonVachhani, MD vgv:cms D: 12/16/2011 18:23:43 ET T: 12/17/2011 12:11:10 ET JOB#: [id number]  cc: Hope PigeonVaibhavkumar G. Elisabeth PigeonVachhani, MD, <Dictator> Lucillie GarfinkelEmma R. Mayford KnifeWilliams, MD Dr. Zachery Dakinslarence Williams, [place], [facility]   Yarden Manuelito [facility] Center[facility]ACHHANI MD ELECTRONICALLY SIGNED 12/29/2011 22:08

## 2014-05-29 NOTE — Consult Note (Signed)
CC: GI bleed, probable duodenal ulcers.  Pt breathing signif better today with hgb up to 8.8 after transfusion,. color looks better.  Chest with better air flow.  He can go home on bid PPI and follow up with Dr.Oh.  Pt should also go home on iron pills, I warned him his stools would be black. Discussed with Hospitalist and patient.  Electronic Signatures: Scot JunElliott, Robert T (MD)  (Signed on 03-Nov-13 10:10)  Authored  Last Updated: 03-Nov-13 10:10 by Scot JunElliott, Robert T (MD)

## 2014-05-29 NOTE — Consult Note (Signed)
PATIENT NAME:  [name], Jeremiah C MR#:  [id number] DATE OF BIRTH:  06/14/1960  DATE OF CONSULTATION:  11/21/2011  REFERRING PHYSICIAN:  Enedina FinnerSona Patel, MD CONSULTING PHYSICIAN:  Lurline DelShaukat Kaymen Adrian, MD  PRIMARY CARE PHYSICIAN: Ulanda EdisonEmma Williams, MD  REASON FOR CONSULTATION: Abdominal pain, jaundice, and melena.   HISTORY OF PRESENT ILLNESS: This is a 54 year old Caucasian male with history of alcoholic liver disease. The patient has been admitted in the past with significant hyperbilirubinemia. He has history of gastroesophageal reflux disease, chronic tobacco and alcohol abuse. The patient came to the Emergency Room yesterday with increasing abdominal pain, nausea, and dry heaves. The pain was described as generalized. The patient is quite sleepy this morning and therefore a detailed history is not available. He denies any fever or chills. Apparently the patient has also been noticing dark stools lately. No vomiting or hematemesis. No hematochezia. The patient was found to have a white cell count of 19,000 and a bilirubin of 18.3. His hemoglobin was 9.7. The patient was admitted by Dr. Enedina FinnerSona Patel today to the Critical Care Unit. The patient was evaluated this morning. He denies any specific symptoms. He is very sleepy although arousable. Apparently he has been having bowel movements due to lactulose, but no further melena has been reported. There has been no vomiting, hematemesis, or hematochezia.   PAST MEDICAL HISTORY:  1. History of gastroesophageal reflux disease.  2. History of diverticulosis on a colonoscopy that was done this year.  3. History of chronic anemia. EGD in December of 2012 was normal. 4. Hypertension. 5. History of congestive heart failure with cardiomyopathy and ejection fraction of 25%.  6. Hepatitis B was mentioned in the patient's record probably because this possibility was raised on a liver biopsy done last year. Prior serologies show the patient to be negative for hepatitis B  surface antigen and in fact he is immune to hepatitis B with high hepatitis B antibody titers.   PAST SURGICAL HISTORY: History of some sort of neck surgery.   ALLERGIES: No known drug allergies.   HOME MEDICATIONS:  1. Zoloft 25 mg a day. 2. Lisinopril 5 mg a day. 3. Lasix 20 mg a day. 4. Aspirin 325 mg a day. 5. Advil p.r.n.  6. Combivent. 7. Coreg 12.5 mg twice a day.  8. Vitamin B12.   SOCIAL HISTORY: He lives with his partner at home. He smokes about half pack a day. He drinks about four to five cans of 24 ounces of beer on a daily basis.  REVIEW OF SYSTEMS: Difficult to obtain, but is grossly negative.   FAMILY HISTORY: History of liver cancer in his mother who was an alcoholic as well.   PHYSICAL EXAMINATION:   GENERAL: Fairly well built male. He is deeply jaundiced. Sleepy. Does not appear to be in any acute distress and does not appear to be septic or toxic.  VITALS: Heart rate is around 100, respirations 18 to 20, and blood pressure 100/49.  SKIN: Otherwise unremarkable.   NECK: Veins are flat.   PULMONARY: Lungs are clear to auscultation bilaterally with fair entry.   CARDIOVASCULAR: Regular rate and rhythm. No gallops or murmur.   ABDOMEN: Examination showed somewhat distended abdomen. Liver is somewhat enlarged, especially the left lobe. No splenomegaly was noted. Some dullness was noted in both flank areas concerning for moderate ascites.   EXTREMITIES: No edema.   NEUROLOGIC: The patient is quite sleepy, although neurological examination appears to be nonfocal.  LABS: ALT is 179 and AST 371.  Total bilirubin was 18.4 and is down to 16.4 today. Creatinine is 0.64. Serum ammonia is 82. INR is 1.4. White cell count is 12.5 and hemoglobin was 9.7 on admission and 7.9 today. Platelet count is 139.   ASSESSMENT AND PLAN: This is a patient with multiple GI issues. 1. Questionable melena. The patient has history of chronic anemia, although hemoglobin and hematocrit  have dropped further. There are no signs of active gastrointestinal blood loss at this point and his EGD less than a year ago was normal. The patient was admitted with some dry heaves and a Mallory-Weiss tear is a possibility as well as lesions such as alcoholic gastritis or peptic ulcer disease due to his use of nonsteroidals. Again, there are no signs of active gastrointestinal blood loss.  2. Significant jaundice. This is secondary to alcoholic liver disease. There is no evidence of hepatitis B. The patient had similar admissions with significant hyperbilirubinemia secondary to alcoholic liver disease in the past.  3. Hepatic encephalopathy. The patient's ammonia is elevated and he appears to be moderately encephalopathic.  4. Questionable ascites. We will obtain an ultrasound to confirm.  5. Leukocytosis. This is commonly seen in patients with alcoholic hepatitis, although we will continue to look for any occult infection.  6. Hypertension. This is most likely related to chronic liver disease.   RECOMMENDATIONS: I agree with IV antibiotics and IV PPI and continue lactulose and prednisone. We will follow LFTs and serum ammonia. Ultrasound of the abdomen today. When the patient is more awake, he can be started on clear liquid diet. I agree with IV antibiotics and packed RBC transfusion. His overall prognosis is poor. We will follow.  ____________________________ Lurline Del, MD si:slb D: 11/21/2011 10:54:55 ET     T: 11/21/2011 11:15:20 ET        JOB#: [id number] cc: Lurline Del, MD, <Dictator> Lurline Del MD ELECTRONICALLY SIGNED 12/01/2011 17:22

## 2014-05-29 NOTE — Consult Note (Signed)
PATIENT NAME:  [name], Kaisyn C MR#:  [id number] DATE OF BIRTH:  02-Feb-1961  DATE OF CONSULTATION:  12/17/2011  CONSULTING PHYSICIAN:  Ollen GrossPaul S. Willeen CassBennett, MD  ADDENDUM: This was a critical care consultation involving 30 minutes of critical care evaluation including personal communication in consultation with Dr. Belia HemanKasa.   ____________________________ Ollen GrossPaul S. Willeen CassBennett, MD psb:cbb D: 01/11/2012 09:38:30 ET T: 01/11/2012 10:13:56 ET JOB#: [id number]  cc: Ollen GrossPaul S. Willeen CassBennett, MD, <Dictator> Sandi MealyPAUL S Sion Thane MD ELECTRONICALLY SIGNED 01/13/2012 8:55

## 2014-05-29 NOTE — Consult Note (Signed)
Brief Consult Note: Diagnosis: Hepatitis and GI bleed.   Patient was seen by consultant.   Consult note dictated.   Discussed with Attending MD.   Comments: 1. ETOH hepatitis with significant hyperbilirubinemia. No evidence of hepatitis B. 2. Hepatic encephalopathy. 3. Melena with anemia. EGD 10 months ago was normal. No signs of active bleeding. 4. Hypotension most likely secondary to chronic liver disease. 5. Leucocytosis. Seen frequently with ETOH hepatitis and now he is on steroids. Will continue to look for occult infection. 6. ? Ascites.  Recommendations: Agree with IV PPI and IV antibiotics. Continue Prednisone and lactulose. Follow LFT's and ammonia. Agree with PRBC transfusion. Follow H and H. Will get US. May have clear liquids when more awake. Poor prognosis. Will follow.  Electronic Signatures: Lurline DelIftikhar, Christohper Dube (MD)  (Signed 12-Oct-13 10:46)  Authored: Brief Consult Note   Last Updated: 12-Oct-13 10:46 by Lurline DelIftikhar, Shiryl Ruddy (MD)

## 2014-05-29 NOTE — H&P (Signed)
PATIENT NAME:  [name], [name] MR#:  [id number] DATE OF BIRTH:  09-Apr-1960  DATE OF ADMISSION:  12/10/2011  PRIMARY CARE PHYSICIAN: Dr. Ulanda Edison at [facility]   CHIEF COMPLAINT: Bright red blood per rectum and melena along with anemia.   HISTORY OF PRESENT ILLNESS: The patient is a 54 year old Caucasian male with history of diastolic congestive heart failure, multiple duodenal ulcers, chronic anemia, hepatitis B, alcohol abuse, tobacco abuse, presents to the Emergency Room complaining of bright red blood per rectum and melena, sent in by the primary care physician. The patient had this episode yesterday, saw his primary care physician today with hemoglobin dropped to 7.1 from his baseline of 8.5. Dr. Niel Hummer of Gastroenterology was called by the primary care physician, and he was asked to come to the ER. Here the patient's stool output has been negative. He was complaining of shortness of breath, needing 2 liters of oxygen, and his chest x-ray shows congestive heart failure and he is being admitted to the Hospitalist Service with GI bleed and acute on chronic diastolic congestive heart failure.   The patient does not complain of any PND, orthopnea, but does have bilateral lower extremity swelling for a week. He continues to smoke but mentions that he has not had any alcohol since discharge on the 18th.   At the time of discharge, the  patient's hemoglobin was 8.4. He was admitted from 10/11 to 10/18 with acute blood loss anemia, multiple duodenal ulcers on EGD. He also had severe delirium tremens, alcoholic hepatitis with bilirubin of 14, which is down to one today.   PAST MEDICAL HISTORY:  1. Multiple duodenal ulcers.  2. Alcohol abuse. The patient quit alcohol since discharge.  3. Tobacco abuse.  4. GERD.  5. Hepatitis B, biopsy-proven.  6. Diverticulosis.  7. Chronic anemia.  8. Hypertension.  9. Diastolic congestive heart failure.  PAST SURGICAL HISTORY:  1. Neck  surgery. 2. EGD.   ALLERGIES: No known drug allergies.   MEDICATIONS:  1. Zoloft 25 mg oral daily.  2. Coreg 12.5 mg oral b.i.d.  3. Cyanocobalamin 1000 mg oral daily.   SOCIAL HISTORY: The patient lives with his partner at home. He smokes 1/2 pack a day. He used to drink 4 to 5 cans of 24-ounce beer daily but quit since his prior discharge.   CODE STATUS:  FULL CODE.     REVIEW OF SYSTEMS: CONSTITUTIONAL: Positive for fatigue, weakness. No weight loss or weight gain. EYES: No blurred vision, double vision. No icterus. ENT: No tinnitus, ear pain, hearing loss. RESPIRATORY: Has cough, wheezing, and shortness of breath. CARDIOVASCULAR: No chest pain, orthopnea, but does have bilateral lower extremity edema. GASTROINTESTINAL: No nausea, vomiting, abdominal pain. Does have melena and bright red blood per rectum. GENITOURINARY: No dysuria, hematuria or frequency. ENDOCRINE: No polyuria, nocturia, thyroid problems. HEMATOLOGIC: Positive for chronic anemia. No easy bruising, bleeding. SKIN: No ulcers or rash. MUSCULOSKELETAL: Positive for arthritis. NEUROLOGIC: No cerebrovascular accident or transient ischemic attack. PSYCHIATRIC: No anxiety or depression.   FAMILY HISTORY: Mother with liver cancer who was also an alcoholic. Father had lung cancer.   ALLERGIES: No known drug allergies.   PHYSICAL EXAMINATION:  VITAL SIGNS: Temperature 97.8, pulse 107, respirations 24, blood pressure 105/64, saturating 91% on room air and 97% on 2 liters oxygen.   GENERAL: Morbidly obese Caucasian male patient lying in bed, comfortable, conversational, cooperative with exam.   PSYCHIATRIC: Alert and oriented x3. Mood and affect appropriate. Judgment intact.   HEENT:  Atraumatic, normocephalic. Oral mucosa moist and pink. External ears and nose normal. No pallor. No icterus. Pupils bilaterally equal and reactive to light.   NECK: Supple. No thyromegaly. No palpable lymph nodes. Trachea midline. No carotid bruit,  JVD.   CARDIOVASCULAR: S1, S2, regular rate and rhythm without any murmurs.   RESPIRATORY: Using accessory muscles, bilateral crackles and wheezing.   GASTROINTESTINAL: Soft abdomen, nontender. Bowel sounds present. No hepatosplenomegaly palpable. Distended.   SKIN: Warm and dry. No petechiae, rash, ulcers.   MUSCULOSKELETAL: No joint swelling, redness, effusion of the large joints. Normal muscle tone.   NEUROLOGICAL: Motor strength 5/5 in upper and lower extremities. Sensation to fine touch intact all over.   LYMPHATIC: No cervical lymphadenopathy.   LABORATORY, DIAGNOSTIC AND RADIOLOGICAL DATA: Laboratory studies show glucose of 231, BUN 9, creatinine 0.92, sodium 143, potassium 4.3, albumin 2.5, bilirubin 1.1. WBC 6.9, hemoglobin 7.1, platelets 189 with MCV 107. Blood group A negative. INR 1.1. Chest x-ray shows mild pulmonary edema. No other acute cardiopulmonary disease.   ASSESSMENT AND PLAN:  1. Melena and bright red blood per rectum: The patient has been sent in by Dr. Niel HummerIftikhar and primary care physician. We will admit the patient on a Protonix drip. Check CBC tonight and tomorrow morning. Consult Dr. Niel HummerIftikhar of Gastroenterology for consideration of possible EGD. The patient will be on a liquid diet. No nonsteroidal anti-inflammatory drugs.  2. Deep venous thrombosis prophylaxis with SCDs. No heparin or Lovenox.  3. Acute on chronic anemia likely from the GI bleed: We will also check B12 and folate. The patient definitely has iron deficiency anemia and should be on iron replacement which can be started at discharge. This is secondary to the GI bleed and alcoholic liver disease.  4. Acute on chronic diastolic congestive heart failure. Last echo showed ejection fraction of 55% with impaired LV relaxation. We will start Lasix. The patient has borderline blood pressures. No beta blockers at this time. These can be started tomorrow if his blood pressures are in the normal range.   5. Alcohol abuse: The patient quit alcohol since last discharge.  6. Tobacco abuse: The patient was counseled for more than three minutes to quit smoking. He understands and has requested a nicotine patch, which will be given.  7. Hyperglycemia: The patient has glucose of 231. He does not have history of diabetes mellitus. We will put him on sliding scale insulin, check an HbA1c. The patient is on steroids as an outpatient for recent alcoholic hepatitis which could be causing his hyperglycemia.   CODE STATUS:  FULL CODE.     TIME SPENT: Time spent today on this case was 50 minutes with more than 50% time spent in coordination of care.  ____________________________ Molinda BailiffSrikar R. Aveah Castell, MD srs:cbb D: 12/10/2011 18:11:41 ET T: 12/10/2011 18:32:18 ET JOB#: [id number]  cc: Wardell HeathSrikar R. Latiya Navia, MD, <Dictator> Lucillie GarfinkelEmma R. Mayford KnifeWilliams, MD Lurline DelShaukat Iftikhar, MD Orie FishermanSRIKAR R Allexa Acoff MD ELECTRONICALLY SIGNED 12/11/2011 13:28

## 2014-05-29 NOTE — Consult Note (Signed)
EGD showed 2 duodenal ulcers that are healing. NO stigmata of recent bleeding. Continue protonix daily. Moniter stool for more bleeding. If rectal bleeding does not stop, may also need video capsule study. Had colonoscopy earlier this year, which did show diverticulosis.colonoscopy. Dr. Mechele CollinElliott will cover this weekend. THanks.   Electronic Signatures: Lutricia Feilh, Alyrica Thurow (MD) (Signed on 01-Nov-13 11:57)  Authored   Last Updated: 01-Nov-13 11:59 by Lutricia Feilh, Shaeleigh Graw (MD)

## 2014-05-29 NOTE — Consult Note (Signed)
CC: gi bleed.  It is possible his black stool on onset of illness could come from the ulcers seen on EGD.  he has signif SOB doing simple things like getting in and out of bed.  Hx of diastolic CHF.  He has decreased air flow on breathing per exam today.  No pain, , no nausea or vomiting.  Stool is beige color and soft.  He may benefit from another unit of blood due to his CHF and COPD, even with the strict new guide lines.  Electronic Signatures: Scot JunElliott, Cyril Woodmansee T (MD)  (Signed on 02-Nov-13 12:50)  Authored  Last Updated: 02-Nov-13 12:50 by Scot JunElliott, Daritza Brees T (MD)

## 2014-05-29 NOTE — H&P (Signed)
PATIENT NAME:  [name], Jeremiah C MR#:  [id number] DATE OF BIRTH:  04-10-1960  DATE OF ADMISSION:  12/17/2011  PRIMARY CARE PHYSICIAN: Dr. Ulanda EdisonEmma Williams at [facility]   HISTORY OF PRESENT ILLNESS: Patient is a 54 year old Caucasian male with past medical history significant for history of diastolic congestive heart failure, history of multiple duodenal ulcers, recent admission to the hospital for GI bleed discharged on 12/13/2011, is coming back to the hospital with complaints of progressive shortness of breath. Apparently patient was having some shortness of breath in the hospital prior to discharge, however, he was discharged, he felt quite satisfactory and after that he started having progressive shortness of breath. He has dyspnea on exertion as well as some orthopnea. He admits of wheezing as well as phlegm production, however, denies any fevers or chills. His phlegm is yellow in color. He is using oxygen at home at 2.5 liters of oxygen through nasal cannula 24/7 but because of progressive shortness of breath he decided to come to the Emergency Room for further evaluation. In the Emergency Room he had ABGs done which were markedly abnormal with pH of 7.27, pCO2 of 93 and pO2 of 73 on 40% FiO2. BiPAP was placed and patient remains on BiPAP now. He seems to be a little bit more comfortable on BiPAP.   PAST MEDICAL HISTORY:  1. History of GI bleed, admission for the same on 12/10/2011 through 12/13/2011.  2. History of multiple duodenal ulcers in the past. 3. History of alcohol abuse. 4. Tobacco abuse.  5. Gastroesophageal reflux disease. 6. Hepatitis B biopsy proven. 7. Diverticulosis. 8. Chronic anemia. 9. Hypertension. 10. Diastolic congestive heart failure.   PAST SURGICAL HISTORY:  1. Neck surgery. 2. EGD. Recent EGD was done on 12/11/2011 by Dr. Bluford Kaufmannh which showed normal esophagus, erythematous mucosa in antrum. Examination was otherwise normal. Multiple duodenal ulcers, however, were  noted.   ALLERGIES: No known drug allergies.   MEDICATIONS: According to medical records patient is on: 1. Combivent 2 puffs every six hours as needed.  2. Coreg 12.5 mg half tablet which is going to be 6.25 mg twice daily. 3. Cyanocobalamin 1000 mcg p.o. daily. 4. Iron sulfate 325 mg p.o. three times daily. 5. Folic acid 1 mg p.o. daily. 6. Lactulose 30 mL twice daily. 7. Lasix 20 mg p.o. daily.  8. Lisinopril 5 mg p.o. daily.  9. Magnesium oxide 400 mg p.o. daily. 10. Pantoprazole 40 mg p.o. twice daily.  11. Prednisone; patient was given prednisone upon discharge, he finished that prednisone therapy. 12. Zoloft 25 mg p.o. daily.   SOCIAL HISTORY: Patient lives with his partner at home. Smokes approximately half pack per day. Used to drink four to five cans of 24-ounce beer daily, however, states quit prior to discharge.   CODE STATUS: FULL CODE.   REVIEW OF SYSTEMS: CONSTITUTIONAL: Positive for sinus congestion, also cough and wheezes with significant shortness of breath, also intermittent nausea. Otherwise denies any fevers, fatigue, weakness, pains, weight loss or gain. EYES: In regards to eyes, denies any blurry vision, double vision, glaucoma, cataracts. ENT: Denies any tinnitus, allergies, epistaxis, sinus pain, dentures, difficulty swallowing. RESPIRATORY: Admits of wheezes. Denies any hemoptysis, asthma, painful respirations. CARDIOVASCULAR: Denies chest pain, orthopnea, edema, palpitations or syncope. GASTROINTESTINAL: Denies any vomiting, diarrhea, constipation, rectal bleeding, change in bowel habits. GENITOURINARY: Denies dysuria, hematuria, frequency, incontinence. ENDOCRINOLOGY: Denies any polydipsia, nocturia, thyroid problems, heat or cold intolerance, or thirst. HEME: Denies any anemia, easy bruising, bleeding, swollen glands. SKIN: Denies any  acne, rash, lesions, change in moles. MUSCULOSKELETAL: Denies arthritis, cramps, swelling. NEUROLOGICAL: Denies any numbness,  epilepsy, tremor. PSYCH: Denies anxiety, insomnia.   PHYSICAL EXAMINATION:  VITAL SIGNS: On arrival to the hospital patient's vital signs: Temperature 98.4, pulse 112, respiratory rate 26, blood pressure 183/116, oxygen saturation was 100% on oxygen therapy.   GENERAL: This is a well-nourished, obese Caucasian male in moderate to severe respiratory distress, uses BiPAP and breathing excessively.   HEENT: His pupils are equal, reactive to light. Extraocular muscles are intact. No icterus or conjunctivitis. Has normal hearing. No pharyngeal erythema. Mucosa is dry.   NECK: No masses, supple, nontender. Thyroid not enlarged. No adenopathy. No JVD or carotid bruits bilaterally. Full range of motion.   LUNGS: Crackles as well as wheezes bilaterally, diminished breath sounds as well as dullness to percussion on the left especially at bases, crackles as well as wheezing more on the right side, rhonchi as well, labored inspirations as well as increased effort to breathe. Patient is in respiratory distress.   CARDIOVASCULAR: S1, S2 appreciated. No murmurs, gallops or rubs noted. Tachycardic. PMI not lateralized. Chest is nontender to palpation. 1+ pedal pulses. 1+ lower extremity edema bilaterally was noted. No calf tenderness or cyanosis was noted.   ABDOMEN: Protuberant, moderately firm, nontender. No hepatosplenomegaly or masses were noted.   RECTAL: Deferred.   MUSCULOSKELETAL: Muscle strength able to move all extremities. No cyanosis, degenerative joint disease or kyphosis. Gait is not tested.   SKIN: Skin did not reveal any rashes, lesions, erythema, nodularity, induration. It was warm and dry to palpation.   LYMPH: No adenopathy in cervical region.   NEUROLOGICAL: Cranial nerves grossly intact. Sensory is intact. No dysarthria, aphasia. Patient does have some few petechia in upper chest.   PSYCH: Patient is alert, oriented to time, person, and place, cooperative. Memory is good. No  significant confusion, agitation, depression.  LABORATORY, DIAGNOSTIC AND RADIOLOGICAL DATA: BMP within normal limits except of bicarbonate level as high as 36, B-type natriuretic peptide 3197, magnesium level 1.7, lipase 175. Liver enzymes: Albumin level 3.1, total bilirubin 1.5, AST elevated to 54. Cardiac enzymes, first set negative. White blood cell count 10.4, hemoglobin 10.4, platelet count 307. Absolute neutrophil count is not checked. pH was done on 40% FiO2, pH 7.27, pCO2 93, pO2 73, saturation 95.4%. Chest portable single view, 12/12/2011 showed progressive atelectasis or pneumonia at the left lung base and patchy atelectasis at right lung base. EKG revealed sinus tachycardia at 107 beats per minute, normal axis, no acute ST-T changes were noted.   ASSESSMENT AND PLAN:  1. Acute on chronic respiratory failure. Admit patient to Critical Care Unit to continue oxygen as needed, wean off BiPAP as tolerated keeping patient's pulse oximetry at around 88% to 92%. 2. Chronic obstructive pulmonary disease exacerbation. Continue Solu-Medrol, DuoNebs as well as tiotropium and Symbicort. 3. Bacterial pneumonia. As patient was recently in the hospital we will continue him for atypical coverage as well as resistant bacterial coverage including Levaquin, Zosyn as well as vancomycin. Will get sputum cultures if possible.  4. History of congestive heart failure. Will continue outpatient medications.  5. Elevated transaminases, unclear etiology. Could be that patient again drank some alcohol so will check his alcohol level as well.  6. Tobacco abuse. Discussed with patient for four minutes. Nicotine replacement therapy will be initiated.   TIME SPENT: 1 hour, 50 minutes.   ____________________________ Katharina Caper, MD rv:cms D: 12/17/2011 10:24:37 ET T: 12/17/2011 11:10:18 ET JOB#: [id number]  cc:  Katharina Caper, MD, <Dictator> Lucillie Garfinkel. Mayford Knife, MD Katharina Caper MD ELECTRONICALLY SIGNED 12/25/2011  13:12

## 2014-05-29 NOTE — H&P (Signed)
PATIENT NAME:  [name], [name] Jeremiah#:  [id number] DATE OF BIRTH:  [birth year]  DATE OF ADMISSION:  11/20/2011  PRIMARY CARE PHYSICIAN: Dr Ulanda Edison at [facility].   CHIEF COMPLAINT: Abdominal pain, nausea, and dry heaves for last several days.    HISTORY OF PRESENT ILLNESS: [name] is a 54 year old Caucasian gentleman, well known to our service from previous admission, has history of alcohol induced liver disease along with history of hepatitis B, biopsy proven, with history of gastroesophageal reflux disease and chronic tobacco abuse and alcohol abuse, comes to the emergency room with increasing abdominal pain, nausea, and dry heaves. The patient describes pain as generalized and a dull aching. Denies any fever. He also started noticing dark to black-colored stools since last night. Did not see any blood clots or bright red blood. Next, in the emergency room, patient was found to have elevated white count of 19,000 along with significantly elevated bilirubin of 18.3 with elevated liver function tests suggestive of alcohol-induced hepatitis. Patient is being admitted. His hemoglobin is 9.7. He is being admitted for acute alcoholic hepatitis, SIRS secondary to possible colitis, elevated white count, and nausea with dry heaves.   PAST MEDICAL HISTORY:  1. Gastroesophageal reflux disease.  2. Hepatitis B which is biopsy proven.  3. Diverticulosis per colonoscopy done in January 2013.  4. Chronic anemia. The patient had GI workup done in December 2012. Endoscopy at that time was negative.  5. Hypertension.  6. History of congestive heart failure. 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   7. Chronic alcohol abuse.   PAST SURGICAL HISTORY: Neck surgery.   ALLERGIES: No known drug allergies.   MEDICATIONS:  1. Zoloft 25 mg daily.  2. Lisinopril 5 mg daily.  3. Lasix 20 mg daily.  4. Cyanocobalamin 1000 mg p.o. daily.  5. Coreg  12.5 mg b.i.d.  6. Combivent 2 puffs every 6 hours.  7. Aspirin 325 mg p.o. daily.  8. Advil 200 mg 1 tablet every 4 hours as needed.   SOCIAL HISTORY: Lives with his partner at home. He smokes about 1/2 pack a day. He drinks about 4 to 5 cans of 24-ounce beer on a daily basis. His last drink was 48 hours ago. The patient says he is not sexually active with his partner.    REVIEW OF SYSTEMS: CONSTITUTIONAL: Positive for fatigue, weakness. No fever. EYES: No blurred or double vision. Positive for icterus. ENT: No tinnitus, ear pain, and hearing loss. RESPIRATORY: No cough, wheeze, hemoptysis. No dyspnea. CARDIOVASCULAR: No chest pain, orthopnea, or edema. GASTROINTESTINAL: Positive for nausea, abdominal pain, and dry heaves. Positive for GERD.  Positive for melena and dark maroon stools. GU: No dysuria, hematuria, or frequency. ENDOCRINE: No polyuria, nocturia, or thyroid problems. HEMATOLOGY: Positive for chronic anemia.  SKIN: Positive for petechial lesions in both upper and lower extremities. MUSCULOSKELETAL: Positive for arthritis. NEUROLOGIC: No cerebrovascular accident, transient ischemic attack. PSYCH: No anxiety or depression. All other systems reviewed are negative.   FAMILY HISTORY: Mother with liver cancer. She was an alcoholic, too. Father with lung cancer. Grandfather had 2 heart attacks.   PHYSICAL EXAMINATION:  GENERAL/VITAL SIGNS: The patient is seen in the emergency room. His temperature is 98.3. Overall looks critically ill, toxic-appearing. Pulse is 118 to 120, tachycardic, respirations 24, blood pressure is 108/71, sats are 97% on 2 liters.   HEENT: Atraumatic, normocephalic. PERRLA.  Scleral icterus present.   NECK: Supple. No JVD. No  carotid bruit.   LUNGS: Clear to auscultation, decreased breath sounds at the bases. Some coarse breath sounds anteriorly. No respiratory distress or labored breathing.   CARDIOVASCULAR: Tachycardia present. Both the heart sounds are normal. No  murmur heard. PMI not lateralized. Chest nontender.   EXTREMITIES: Feeble pedal pulses. No lower extremity edema. Good femoral pulses.   ABDOMEN: Distended, soft. There is no ascites present. There is mild generalized tenderness present. On palpation given morbid obesity could not appreciate hepatosplenomegaly.   NEUROLOGIC: The patient is awake. He is alert. He is oriented x3. No asterixis or tremors. No focal neuro weakness is noted. Reflexes 1+ in both lower extremities. Plantars downgoing.   SKIN: Petechial lesions in both upper and lower extremities. Scleral icterus and the patient appears severely jaundiced.   PSYCHIATRIC: The patient is awake, alert, and mildly depressed.  EKG shows sinus tachycardia. Chest x-ray: No acute cardiopulmonary abnormality. White count is 19.2, hemoglobin is 9.7 and hematocrit 27.3, platelet count of 253,000. Glucose 140, BUN is 19, creatinine is 0.55, sodium is 127, potassium is 4.3, chloride 89, calcium is 8.6. Total bilirubin is 18.3, alkaline phosphatase is 377, SGPT is 2.2, SGOT is 438, albumin is 2.4. Troponin is normal. Urinalysis negative for UTI.  PT/INR 17.3 and 1.4. Ammonia is 8.82.   ASSESSMENT AND PLAN: A 54 year old [name] with history of liver disease, biopsy-proved, hepatitis B, alcohol abuse, gastroesophageal reflux disease, hypertension, cardiomyopathy which appears to be alcohol induced, comes in with:  1. Acute alcohol-induced hepatitis. The patient comes in with abdominal pain, nausea, dry heaves. Drinks daily 4 to 5 cans of 24-ounce beer, last drink 48 hours ago. His discriminate factor index is 43. Discussed with GI, Dr Niel HummerIftikhar. We will give prednisone starting 40 mg daily for 28 days and then taper per Dr. Marlan PalauIftikhar's recommendations. We will also continue IV PPI. Dr. Niel HummerIftikhar will see the patient in consultation. We will follow daily LFTs and give MVI drip as well. We will continue IV fluids.  2. Dark maroon black-colored stools, likely  due to chronic slow upper gastrointestinal bleed versus colitis versus hemorrhoidal due to liver disease. The patient has had colonoscopy in January 2013 that showed diverticular disease.  Had EGD done in December of 2012 which was normal. We will continue to monitor hemoglobin and hematocrit, transfuse as needed. No luminal evaluation per Dr. Niel HummerIftikhar at this time unless patient actively starts bleeding.  3. Systemic inflammatory response syndrome secondary to possible colitis. Patient has had abdominal pain, leukocytosis, tachycardia, nausea, vomiting. We will cover empirically with IV Zosyn, follow blood cultures, and cbc.    4. Anemia of chronic disease and now likely slow GI bleed. Monitor hemoglobin and hematocrit closely and transfuse as needed.  5. Acute encephalopathy, appears to be metabolic secondary to liver disease. The patient is able to answer all my questions appropriately. His ammonia level is 82. He is on lactulose. We will resume that. Is not affording Xifaxan due to the cost, which he has discontinued a long time ago.  6. Alcohol dependence.  We will start CIWA protocol with IV Ativan, continue MVI drip.  7. Tobacco abuse. The patient advised smoking cessation, about three minutes spent.  8. Deep vein thrombosis prophylaxis with SCDs and TEDs. No antiplatelet agent in the setting of gastrointestinal bleed.  9. Further workup according to the patient's clinical course. Hospital admission plan was discussed with the patient. No family members were present.   CODE STATUS: CODE STATUS WAS ADDRESSED WITH THE PATIENT.  HE WANTS TO BE A FULL CODE.   Case was also discussed with Dr. Niel Hummer.   CRITICAL TIME SPENT: 60 minutes   ____________________________ Jearl Klinefelter A. Allena Katz, MD sap:vtd D: 11/20/2011 19:15:01 ET T: 11/21/2011 07:10:54 ET JOB#: [id number]  cc: Raguel Kosloski A. Allena Katz, MD, <Dictator> Lucillie Garfinkel. Mayford Knife, MD Willow Ora MD ELECTRONICALLY SIGNED 11/24/2011 1:37

## 2014-05-29 NOTE — Discharge Summary (Signed)
PATIENT NAME:  [name], [name] MR#:  [id number] DATE OF BIRTH:  Jan 28, 1961  DATE OF ADMISSION:  11/20/2011 DATE OF DISCHARGE:  11/27/2011  PRIMARY CARE PHYSICIAN: Dr. Ulanda Edison at [facility]   DISCHARGE DIAGNOSES:  1. Multiple duodenal ulcers.  2. Upper gastrointestinal bleed.  3. Acute blood loss anemia, status post 1 unit blood transfusion. 4. Delirium tremens/alcohol withdrawal.  5. Acute alcoholic hepatitis.  6. Hepatic steatosis. 7. Tobacco abuse.  8. Acute encephalopathy.   CONSULTANTS: Dr. Niel Hummer, Gastroenterology.   IMAGING STUDIES: Chest x-ray showed no acute cardiopulmonary disease. Ultrasound of the abdomen showed no significant ascites, showed hepatic steatosis. Echocardiogram showed mild diastolic dysfunction. Ejection fraction greater than 55%.   PROCEDURES: Ultrasound-guided paracentesis. Paracentesis was not performed as per ultrasound very small ascites was found.   ADMITTING HISTORY AND PHYSICAL: Please see detailed History and Physical dictated on 11/20/2011. In brief, the patient is a 54 year old Caucasian male with history of alcohol-induced liver disease, hepatitis B, gastroesophageal reflux disease, tobacco abuse, presented to the Emergency Room complaining of abdominal pain, nausea, and dry heaves. The patient was admitted to the hospitalist service with acute alcohol-induced hepatitis and with upper gastrointestinal bleed with melena.   HOSPITAL COURSE:  1. Alcohol-induced hepatitis: The patient had elevated bilirubin up to 14 and on the day of discharge this is slowly trending down to 3.0. Secondary to high MELD  score, the patient has been started on prednisone which will be tapered as an outpatient. The patient will follow up with Dr. Niel Hummer of GI in two weeks. The patient has been counseled extensively to quit alcohol completely.  2. Multiple duodenal ulcers: The patient did have clean base multiple duodenal ulcers which were nonbleeding while the  EGD was done. The patient did receive 1 unit of packed RBC for acute blood loss anemia during the hospital stay, and over the last five days in the hospital stay the patient's hemoglobin has been stable. The patient is being discharged home on twice daily PPIs.  3. Alcohol withdrawal and delirium tremens: The patient did have significant benzodiazepine requirement during the hospital stay secondary to alcohol withdrawal, went into delirium tremens causing tachycardia, hypertension, and acute encephalopathy. The patient had elevated ammonia for which he was being started on lactulose. This should be continued as outpatient. He looks much better with minimal tremors. Heart rate is well controlled around 100 and no withdrawal symptoms at this time six days out of the last drink, and he is being discharged home on low-dose diazepam for another three days.  4. The patient was also treated for possible colitis initially which was thought to be unlikely, and antibiotics were stopped.   On the day of discharge, the patient's temperature is 98.1, pulse of 105, blood pressure 132/86, saturating 96% on 2 liters oxygen. He is being discharged home on the oxygen, which he wears normally, secondary to chronic obstructive pulmonary disease.   DISCHARGE MEDICATIONS:  1. Coreg 12.5 mg oral b.i.d.  2. Lasix 20 mg oral once a day.  3. Cyanocobalamin 1000 mcg oral once a day.  4. Combivent 2 puffs inhaled every 6 hours as needed. 5. Zoloft 25 mg oral once a day.  6. Prednisone 20 mg oral once a day for 15 days, followed by 10 mg oral taper, further management as per GI.  7. Diazepam 2 mg oral b.i.d. for three days.  8. Lactulose 10 mg oral b.i.d.   9. Magnesium oxide 400 mg oral once a day.  10.  Protonix 40 mg oral b.i.d.    DISCHARGE INSTRUCTIONS:  1. Follow up with Dr. Niel HummerIftikhar of GI in 2 weeks.  2. Follow up with primary care physician in a week.  3. The patient has been advised to call his doctor or return to  the Emergency Room if he has worsening abdominal pain or any blood or black stools.  4. He will be on a cardiac diet with activity as tolerated.  5. The patient has been counseled to quit alcohol completely.   The patient has verbalized understanding and is okay with the plan.   TIME SPENT: Time spent today on this discharge dictation along with coordinating care and counseling of the patient was 45 minutes.  ____________________________ Molinda BailiffSrikar R. Jaiel Saraceno, MD srs:cbb D: 11/27/2011 13:54:42 ET T: 11/28/2011 15:04:09 ET JOB#: [id number]  cc: [name] R. Claudeen Leason, MD, <Dictator> Jeremiah GarfinkelEmma R. Mayford KnifeWilliams, MD Jeremiah DelShaukat Iftikhar, MD [name] [place] Bali Eymi Lipuma MD ELECTRONICALLY SIGNED 12/07/2011 13:58

## 2014-05-29 NOTE — Consult Note (Signed)
Chief Complaint:   Subjective/Chief Complaint No signs of bleeding although hemoglobin is lower today. Had 2-3 dark brown BM in last 24 hours. No vomiting. More awake and alert but somwhat tremulous.   VITAL SIGNS/ANCILLARY NOTES: **Vital Signs.:   13-Oct-13 11:17   Vital Signs Type Pre-Blood   Temperature Temperature (F) 98.5   Celsius 36.9   Temperature Source axillary   Pulse Pulse 94   Respirations Respirations 18   Systolic BP Systolic BP 972   Diastolic BP (mmHg) Diastolic BP (mmHg) 63   Mean BP 82   Pulse Ox % Pulse Ox % 96   Oxygen Delivery 3L   Pulse Ox Heart Rate 94   Brief Assessment:   Additional Physical Exam Abdomen is soft with mild tenderness in LUQ area.   Lab Results: Hepatic:  13-Oct-13 07:03    Bilirubin, Total  14.8   Alkaline Phosphatase  271   SGPT (ALT)  164   SGOT (AST)  311   Total Protein, Serum  5.4   Albumin, Serum  2.1  Routine Chem:  13-Oct-13 07:03    Glucose, Serum 82   BUN 12   Creatinine (comp) 0.76   Sodium, Serum 139   Potassium, Serum 3.7   Chloride, Serum 101   CO2, Serum  34   Calcium (Total), Serum  7.8   Osmolality (calc) 276   eGFR (African American) >60   eGFR (Non-African American) >60 (eGFR values <73m/min/1.73 m2 may be an indication of chronic kidney disease (CKD). Calculated eGFR is useful in patients with stable renal function. The eGFR calculation will not be reliable in acutely ill patients when serum creatinine is changing rapidly. It is not useful in  patients on dialysis. The eGFR calculation may not be applicable to patients at the low and high extremes of body sizes, pregnant women, and vegetarians.)   Anion Gap  4  Routine Hem:  13-Oct-13 07:03    WBC (CBC)  11.0   RBC (CBC)  2.19   Hemoglobin (CBC)  7.6   Hematocrit (CBC)  21.6   Platelet Count (CBC)  122   MCV 98   MCH  34.5   MCHC 35.0   RDW  18.9   Neutrophil % 71.9   Lymphocyte % 18.0   Monocyte % 8.3   Eosinophil % 1.5   Basophil %  0.3   Neutrophil #  7.9   Lymphocyte # 2.0   Monocyte # 0.9   Eosinophil # 0.2   Basophil # 0.0 (Result(s) reported on 22 Nov 2011 at 07:30AM.)   Assessment/Plan:  Assessment/Plan:   Assessment ETOH hepatitis wth severe hyperbilirubinemia. ? GI bleed with anemia. No signs of active GI bleed although H and H is low. ETOH withdrawl. ? Hepatic encephalopathy, clinically better.    Plan Agree with PRBC transfusion. Continue IV PPI infusion. Will consider EGD tomorrow depending on his hospital course over next several hours. Further recommendatons to follow.   Electronic Signatures: IJill Side(MD)  (Signed 13-Oct-13 11:39)  Authored: Chief Complaint, VITAL SIGNS/ANCILLARY NOTES, Brief Assessment, Lab Results, Assessment/Plan   Last Updated: 13-Oct-13 11:39 by IJill Side(MD)

## 2014-05-29 NOTE — Consult Note (Signed)
Pt seen and examined. Full consult to follow. Recent discharge from hospital for alcoholic hepatitis and UGI bleeding. Now with increasing SOB and drop in hgb with melena. Insists on no ASA or NSAIDS. NO alcohol since discharge. Has been taking discharge meds faithfully. Epig tenderness. Basilar crackles. NPO. PLan EGD this AM if remains stable. Thanks  Electronic Signatures: Lutricia Feilh, Pressley Barsky (MD)  (Signed on 01-Nov-13 08:06)  Authored  Last Updated: 01-Nov-13 08:06 by Lutricia Feilh, Tiann Saha (MD)

## 2014-06-01 NOTE — Discharge Summary (Signed)
PATIENT NAME:  [name], [name] MR#:  [id number] DATE OF BIRTH:  April 03, 1960  PRIMARY CARE PHYSICIAN: [facility]  FINAL DIAGNOSES: 1.  Acute on chronic hypercapnic respiratory failure.  2.  Possible acute diastolic congestive heart failure.  3.  Chronic obstructive pulmonary disease exacerbation.  4.  Tobacco abuse.  5.  Malignant hypertension.  6.  Restless leg syndrome.  7.  Likely sleep apnea, need outpatient sleep study.  8.  History of hepatitis B.   MEDICATIONS ON DISCHARGE: Include: Vitamin B12 at 1000 mcg orally daily; magnesium oxide 400 mg daily; lactulose 10 g/15 mL, 15 mL twice a day;  sertraline  25 mg daily; Protonix 40 mg twice a day; Combivent 2 puffs every 6 hours as needed for shortness of breath; Coreg 12.5 mg twice a day; furosemide 40 mg daily; Spiriva 1 inhalation daily; Advair Diskus 250/50 one inhalation twice a day - if your insurance company does not fill the Advair, can go back to your QVAR; Requip 1 mg at bedtime; prednisone taper 10 mg 3 tablets day one, 2 tablets day two and three, 1 tablet day four and five, 1/2 tablet day six and seven; Klor-Con 10 mEq daily; levofloxacin 500 mg daily until completed.   DIET: Low-sodium diet, low fat, low cholesterol.   OXYGEN: Two liters nasal cannula. BiPAP at night.   DIET: Regular consistency.   FOLLOWUP: Dr. Meredeth IdeFleming in 1 to 2 weeks.   REASON FOR ADMISSION: The patient was admitted 03/15/2012 and discharged 03/19/2012. Came in with shortness of breath for the past 3 days.   HISTORY OF PRESENT ILLNESS: A 54 year old man with multiple medical problems including COPD, history of congestive heart failure. He had 3 days of shortness of breath, worse with exertion, now even short of breath at rest. He is coughing up clear phlegm. He was found to be hypoxic in the ER and his oxygen requirements went from 2.5 to 4 liters. He was admitted with acute on chronic respiratory failure. He was on 4 liters of oxygen. His pulse oximetry  was around 88% and I tried to decrease him down to 3 liters. He was admitted with possible diastolic congestive heart failure, was started on IV Lasix. He has a history of COPD and was admitted with COPD exacerbation, was started on Levaquin IV and IV Solu-Medrol. Spiriva and DuoNeb were added and he was counseled on his tobacco abuse with smoking cessation.   LABORATORY, DIAGNOSTIC AND RADIOLOGICAL DATA: During the hospital course included a troponin that was negative. Glucose 109, BUN 8, creatinine 0.60, sodium 135, potassium 3.9, chloride 93, CO2 of 40, calcium 8.8. Liver function tests normal. White blood cell count 11.9, H and H 13.5 and 42.4, platelet count 201. BNP 10,770.   EKG showed a normal sinus rhythm, left atrial enlargement, nonspecific ST-T wave changes.   Chest x-ray was consistent with mild interstitial edema. Influenza A and B were negative. Troponin negative. LDL 125, HDL 31, triglycerides 99. ABG was done on February 6 that showed pH of 7.4, pCO2 of 91, pO2 of 61, bicarbonate 54.6, O2 saturation 90.3 and that was on nasal cannula at 32% oxygen. ABG on February 8 showed a pH of 7.37, pCO2 of 89, pO2 of 68, bicarbonate 51.5, O2 oxygen saturation 92.3.   CONSULTATIONS DURING THE HOSPITAL COURSE: Include Dr. Meredeth IdeFleming on pulmonary.   HOSPITAL COURSE PER PROBLEM LIST:  1.  For the patient's acute on chronic hypercapnic respiratory failure, I think this was more related to a pulmonary  issue and COPD exacerbation. The patient is a CO2 retainer. I was able to set up BiPAP for him at night, which he will need in order to keep out of the hospital and blow CO2 off and he does wear oxygen during the day. Hopefully we can keep at a maximum of 2 to 2.5 liters. The patient is high risk for cardiopulmonary arrest and requiring intubation for respiratory distress in the future. Close clinical followup as outpatient is going to be needed. I think his new baseline pCO2 is in the high 80s and low 90s  because his pH is normal.  2.  Possible acute diastolic congestive heart failure. I think this is probably more related to COPD than heart failure. The patient's last echocardiogram was 11/22/2011 that showed a normal EF and normal right-sided pressures. The patient was diuresed with IV Lasix and his Lasix dose was increased up to 40 mg upon discharge. He is already on beta blocker, Coreg.  3.  Chronic obstructive pulmonary disease exacerbation. The patient was started on IV Solu-Medrol, Advair and Spiriva. The patient's lungs were clear upon discharge. The patient was given a prednisone taper, finished course of antibiotics with Levaquin. Hopefully his insurance company will pay for the Advair and Spiriva. Close clinical followup with Dr. Meredeth Ide needed in order to keep the patient out of the hospital.  4.  Tobacco abuse. Smoking cessation is a must for this patient. Counseled 3 minutes by me upon admission.  5.  Malignant hypertension; this had improved. Blood pressure upon discharge 123/78.  6.  Restless leg syndrome, on Requip at night.  7.  Likely sleep apnea. We will need outpatient sleep study.  8.  History of hepatitis B. He is on lactulose to keep ammonia level normal.   TIME SPENT ON DISCHARGE: 40 minutes.   OVERAL PROGNOSIS: Poor. He will need the BiPAP at night to blow off CO2 in order to keep him out of the hospital. This was set up for him upon discharge.      ____________________________ Herschell Dimes. Renae Gloss, MD rjw:cc D: 03/20/2012 15:10:00 ET T: 03/20/2012 17:22:24 ET JOB#: [id number]  cc: Herschell Dimes. Renae Gloss, MD, <Dictator> [facility] E. Meredeth Ide, MD     Salley Scarlet MD ELECTRONICALLY SIGNED 03/30/2012 13:33

## 2014-06-01 NOTE — H&P (Signed)
PATIENT NAME:  [name], [name] MR#:  [id number] DATE OF BIRTH:  [birth year]  DATE OF ADMISSION:  03/15/2012  PRIMARY CARE PHYSICIAN: Dr. Ulanda EdisonEmma Williams at [facility].   CHIEF COMPLAINT: Shortness of breath for the past 3 days.   HISTORY OF PRESENT ILLNESS: This is a 54 year old man with multiple medical problems including chronic respiratory failure on oxygen, history of congestive heart failure and COPD. He is presenting with 3 days of shortness of breath. He is breathing hard, worse with exertion, and now he is even short of breath at rest. He has been coughing up clear phlegm. He has a stuffy head. He also has tightness in the chest going on for the last 3 days, worse daily. He is also feeling bloated. In the ER, he was found to be hypoxic. His oxygen requirements increased from 2.5 to 4 liters. Chest x-ray was consistent with heart failure and hospitalist services were contacted for further evaluation.   PAST MEDICAL HISTORY: Chronic respiratory failure on 2.5 liters of oxygen daily, COPD, tobacco abuse, gastroesophageal reflux disease, diastolic congestive heart failure, hepatitis B, anemia, hypertension, restless legs, history of duodenal ulcers in the past.   PAST SURGICAL HISTORY: Neck surgery.   ALLERGIES: No known drug allergies.   MEDICATIONS: As per Prescription Writer include Coreg 12.5 mg twice a day, Combivent 2 puffs every 6 hours as needed, vitamin B12 at 1000 mcg daily, Lasix 20 mg daily, lactulose 15 mL twice a day, magnesium oxide 400 mg daily, Protonix 40 mg twice a day, QVAR 1 inhalation twice a day, Zoloft 25 mg daily.   SOCIAL HISTORY: Cut back on his smoking down to 5 to 6 cigarettes per day. No alcohol anymore. No drug use. Lives with his partner.   FAMILY HISTORY: Father died of lung cancer at age 52. Mother died of cirrhosis at age 10650.   REVIEW OF SYSTEMS:  CONSTITUTIONAL: No fever or chills or sweats. Questionable weight gain. Positive for fatigue.  EYES: No  blurry vision.  EARS, NOSE, MOUTH, AND THROAT: Positive for stuffed up in the nose. Unable to breathe out of the right nostril. No sore throat. No difficulty swallowing.  CARDIOVASCULAR: Positive for chest pain.  RESPIRATORY: Positive for shortness of breath, coughing up clear phlegm.  GASTROINTESTINAL: No nausea. No vomiting. Upper abdominal pain, bloated. No diarrhea. No constipation. No bright red blood per rectum. No melena.  GENITOURINARY: No burning on urination. No hematuria.  MUSCULOSKELETAL: No more joint pain than usual.  INTEGUMENTARY: No rashes or eruptions.  NEUROLOGIC: No fainting or blackouts.  PSYCHIATRIC: No anxiety or depression.  ENDOCRINE: No thyroid problems.  HEMATOLOGIC AND LYMPHATIC: History of anemia.   PHYSICAL EXAMINATION:  VITAL SIGNS: Temperature 97.4, pulse 96, respirations 28, blood pressure 175/100, pulse oximetry 96% on 4 liters.  GENERAL: Slight respiratory distress.  EYES: Conjunctivae and lids normal. Pupils equal, round and reactive to light. Extraocular muscles intact. No nystagmus.  EARS, NOSE, MOUTH AND THROAT: Tympanic membrane: Bulging and erythematous on the right side, also cloudy. Nasal mucosa on the right: Enlarged turbinate, probable septal deviation. On the left: Enlarged turbinate. Throat: No erythema. No exudate seen. Lips and gums: No lesions.  NECK: Positive JVD. No bruits. No lymphadenopathy. No thyromegaly. No thyroid nodules palpated.  RESPIRATORY: Decreased breath sounds bilaterally. Positive rales bilateral bases. Upper airway wheeze.  CARDIOVASCULAR: S1, S2 normal, positive II/VI systolic ejection murmur. Carotid upstroke 2+ bilaterally, no bruits. Dorsalis pedis pulses 2+ bilaterally, 2+ edema bilateral lower extremity.  ABDOMEN:  Soft, nontender. No organomegaly/splenomegaly. Normoactive bowel sounds. No masses felt.  LYMPHATIC: No lymph nodes in the neck.  MUSCULOSKELETAL: No clubbing, no cyanosis on oxygen, 2+ edema.  SKIN: No  rashes or ulcers seen.  NEUROLOGIC: Cranial nerves II through XII grossly intact. Deep tendon reflexes 1+ bilateral lower extremities.  PSYCHIATRIC: The patient is oriented to person, place and time.   LABORATORY AND RADIOLOGICAL DATA: EKG shows a normal sinus rhythm, 93 beats per minute, left atrial enlargement flattening of T waves laterally. Chest x-ray showed findings consistent with congestive heart failure. BNP 10,770. White blood cell count 11.9, hemoglobin and hematocrit 13.5 and 42.4, platelet count 201. Glucose 109, BUN 8, creatinine 0.60, sodium 135, potassium 3.9, chloride 93, CO2 of 40, calcium 8.8. Liver function tests: Normal range. Troponin negative.   ASSESSMENT AND PLAN:  1.  Acute on chronic respiratory failure. The patient was bumped up to 4 liters of oxygen from his usual 2.5. Since the patient has an elevated CO2 on his chemistry, would rather have him at a pulse oximetry around 88%, try to decrease to 3 liters nasal cannula and hopefully back to his baseline 2.5 liters. I believe this is probably a combination of diastolic heart failure and chronic obstructive pulmonary disease exacerbation.  2.  Acute diastolic congestive heart failure. We will give IV Lasix 40 mg IV b.i.d. The patient did have an echocardiogram back last year that showed an ejection fraction of greater than 55%. The patient is already on Coreg. We will check daily weights and ins and outs.  3.  Chronic obstructive pulmonary disease exacerbation. We will give oral Levaquin, IV Solu-Medrol. Add Spiriva and DuoNeb nebulizer solution. Since we do not have QVAR here, I will have to go with Flovent.  4.  Tobacco abuse. Smoking cessation counseling done 3 minutes by me. Nicotine patch applied.  5.  History of hepatitis B. He is on chronic lactulose. I am not sure if he has a diagnosis of cirrhosis or not.  6.  Malignant hypertension. We will add nitroglycerin patch and continue his Coreg and diurese with Lasix. Continue  to monitor closely.  7.  Anemia. We will continue to monitor.  8.  Restless leg syndrome. Not on any medication for this.  9.  We will send off a flu swab and start empiric Tamiflu.   TIME SPENT ON ADMISSION: 55 minutes.   CODE STATUS: The patient is a full code.   ____________________________ Herschell Dimes. Renae Gloss, MD rjw:jm D: 03/15/2012 17:27:37 ET T: 03/15/2012 18:35:10 ET JOB#: [id number]  cc: Herschell Dimes. Renae Gloss, MD, <Dictator> Lucillie Garfinkel. Mayford Knife, MD Salley Scarlet MD ELECTRONICALLY SIGNED 03/16/2012 13:14

## 2014-06-02 NOTE — H&P (Signed)
PATIENT NAME:  [name], [name] MR#:  [id number] DATE OF BIRTH:  Dec 11, 1960  DATE OF ADMISSION:  11/07/2013  REFERRING PHYSICIAN: Charlestine Night. Scotty Court, MD  PRIMARY CARE DOCTOR: Zachery Dakins, MD  ADMISSION DIAGNOSIS: Respiratory distress.   HISTORY OF PRESENT ILLNESS: This is a 54 year old Caucasian male who presents to the Emergency Department via EMS for worsening shortness of breath. The patient states that he cannot remember how long his respiratory status has been declining. He notes that he fell last night and hurt his arm. He admits that he was short of breath at the time, but feels like he fell more due to the darkness in the room. He denies being lightheaded at the time, but admits that this evening as he was drinking a beer on the couch, the shortness of breath "grabbed" him. He admits to feeling dizzy at this time, but denies any chest pain, nausea, vomiting, or diaphoresis. He admits that he has not been taking his fluid pills. Due to his respiratory distress, the Emergency Department called for admission.  REVIEW OF SYSTEMS:  CONSTITUTIONAL: The patient denies fever or weakness.  EYES: Denies blurred vision or inflammation.  EARS, NOSE AND THROAT: Denies tinnitus or sore throat.  RESPIRATORY: Admits to cough and shortness of breath.  CARDIOVASCULAR: Denies chest pain or palpitations, but admits to dyspnea on exertion.  GASTROINTESTINAL: Denies nausea, vomiting, diarrhea, or abdominal pain.  GENITOURINARY: Denies dysuria, increased frequency, or hesitancy.  ENDOCRINE: Denies polyuria or polydipsia. HEMATOLOGIC AND LYMPHATIC: The patient admits to easy bruising but denies easy bleeding.  INTEGUMENTARY: Denies rash or lesions.  MUSCULOSKELETAL: Admits to some back pain but denies arthralgias.  NEUROLOGIC: Denies numbness in his extremities or dysarthria.  PSYCHIATRIC: Denies depression or suicidal ideation.   MEDICATIONS:  1. Advair 250 mcg-50 mcg inhaled powder 1 puff  BID 2. Carvedilol 12.5 mg, 1 tab po BID 3. Combivent 18 mcg-103 mcg/inhaled aerosol, 2 puffs every 6 hours as needed for shortness of breath 4. Cyanocobalamin 1000 mcg, 1 tab po daily 5. Furosemide 40 mg, 1 tab po daily 6. Klor-Con 10 mEq ER, 1 tab po daily 7. Lactulose 10g/15 mL syrup, 15 ml po BID 8. Levofloxacin 750 mg, 1 tab po daily x5 days 9. Manesium oxide 400 mg, 1 tab po daily 10. Pantoprazole 40 mg, 1 tab po BID 11. Prednisone 10 mg oral tab, tapor from 60 mg by 10 mg per day 12. Requip 1 mg, 1 tab po daily 13. Sertraline 25 mg, 1 tab po daily 14. Tiotropium 18 mcg inhaled capsule, 1 inhalation daily  ALLERGIES: No known drug allergies  PAST MEDICAL HISTORY: COPD and congestive heart failure.   SURGICAL HISTORY: Diskectomy.   SOCIAL HISTORY: The patient smokes less than 1 pack per day. He has been smoking for 26 years. He drinks about 6 beers per week, and he denies any illicit drug use.   FAMILY HISTORY: Hypertension.  PERTINENT LABORATORY RESULTS AND RADIOGRAPHIC FINDINGS: Serum glucose is 131, BUN 11, creatinine 0.54, sodium 120, potassium is 5. Serum chloride is 77, CO2 is 38, serum calcium is 8.1. Troponin is 0.03. White blood cell count is 9.4, hemoglobin 13.8, hematocrit 42.6. Chest x-ray shows cardiac enlargement with pulmonary vascular congestion without pulmonary edema. There is some atelectasis at the bases as well as small pleural effusions. Right wrist x-ray shows probable avulsion fracture of the ulna styloid tip.   PHYSICAL EXAMINATION:  VITAL SIGNS: Temperature is 97.7, pulse 93, respirations 18, blood pressure 155/87, pulse oximetry 90%  on 2 L oxygen via nasal cannula.  GENERAL: The patient is drowsy but oriented x 3, in no apparent distress.  HEENT: Normocephalic, atraumatic. Pupils equal, round, reactive to light and accommodation. Extraocular movements are intact. Moist mucous membranes.  NECK: Trachea is midline. No adenopathy.  CHEST: Symmetric and  barrel-shaped, atraumatic.  CARDIOVASCULAR: Regular rate and rhythm. Normal S1, S2. No rubs, clicks, or murmurs appreciated.  LUNGS: Clear to auscultation bilaterally, although there are some diminished breath sounds at the bases bilaterally. There is no use of accessory muscles.  ABDOMEN: Positive bowel sounds, soft, nontender, nondistended. No hepatosplenomegaly.  GENITOURINARY: Deferred.  MUSCULOSKELETAL: The patient moves all 4 extremities equally. There is 5/5 strength in upper and lower extremities bilaterally.  SKIN: No rashes, but there are some areas of ecchymosis, particularly on his left arm.  EXTREMITIES: No clubbing, cyanosis, or edema.  NEUROLOGIC: Cranial nerves II through XII are grossly intact.  PSYCHIATRIC: Mood is normal. Affect is congruent.   ASSESSMENT AND PLAN: This is a 54 year old male admitted for respiratory distress.  1.  Respiratory distress unclear if it is hypercarbic or hypoxic. We will obtain a blood gas. The patient was on BiPAP originally when he arrived at the Emergency Department, and his respiratory status is clinically improved, but we will follow from a more quantitative standpoint, with results of his blood gas. This is likely a congestive heart failure exacerbation, as his chest x-ray definitely has findings of someone who has congestive heart failure, although there is no overt pulmonary edema. However, the patient does have chronic obstructive pulmonary disease, as well. I have started Levaquin. He also has nitroglycerin paste applied to his chest, which will help with some vasodilatation, especially in the event that he has some diastolic heart failure in addition to a chronic obstructive pulmonary disease exacerbation. We will follow his cardiac enzymes, as well, to make sure this episode of shortness of breath was not precipitated by myocardial ischemia.  2.  Chronic obstructive pulmonary disease. Continue Advair, Spiriva, and albuterol. Will add steroids  if this episode appears to be more consistent with respiratory failure secondary to obstructive disease.  3.  Congestive heart failure. Will continue the current dose of Lasix which will essentially be resuming his home regimen. Also, continue carvedilol and enalapril.  4.  Hyponatremia. Etiology is unclear at this time. The patient does not seem to be hypervolemic as in severe congestive heart failure state. I have started him on maintenance fluid, but we will continue to follow his sodium,  5.  Obesity. Will recommend a low-fat, reduced-calorie diet.  6.  Deep vein thrombosis prophylaxis with Lovenox.  7.  Gastrointestinal prophylaxis with pantoprazole.  CODE STATUS: The patient is a full code.  TIME SPENT ON ADMISSION ORDERS AND PATIENT CARE: Approximately 35 minutes.     ____________________________ Kelton PillarMichael S. Sheryle Hailiamond, MD msd:ST D: 11/08/2013 01:08:04 ET T: 11/08/2013 01:44:47 ET JOB#: [id number]  cc: Kelton PillarMichael S. Sheryle Hailiamond, MD, <Dictator> Kelton PillarMICHAEL S Tennis Mckinnon MD ELECTRONICALLY SIGNED 11/08/2013 7:16

## 2014-06-02 NOTE — Consult Note (Signed)
Chief Complaint:  Subjective/Chief Complaint Pt states to be feeling better but still has sweating and sob. Denies cp and c/o palpitations and tachycardia.   VITAL SIGNS/ANCILLARY NOTES: **Vital Signs.:   03-Oct-15 11:45  Vital Signs Type Routine  Temperature Temperature (F) 97.5  Celsius 36.3  Temperature Source oral  Pulse Pulse 87  Respirations Respirations 18  Systolic BP Systolic BP 90  Diastolic BP (mmHg) Diastolic BP (mmHg) 62  Mean BP 71  Pulse Ox % Pulse Ox % 94  Pulse Ox Activity Level  At rest  Oxygen Delivery 3L  *Intake and Output.:   03-Oct-15 12:16  Grand Totals Intake:   Output:  400    Net:  -400 46 Hr.:  -220  Urine ml     Out:  400  Urinary Method  Urinal   Brief Assessment:  GEN well developed, well nourished, no acute distress   Cardiac Irregular  murmur present  -- carotid bruits  -- JVD  --Rub   Respiratory normal resp effort  clear BS  rhonchi   Gastrointestinal Normal   Gastrointestinal details normal Soft  Nontender  Nondistended  No masses palpable   EXTR negative cyanosis/clubbing, negative edema, right arm in a splint   Lab Results: LabObservation:  03-Oct-15 10:01   OBSERVATION Reason for Test  Cardiology:  03-Oct-15 10:01   Echo Doppler REASON FOR EXAM:     COMMENTS:     PROCEDURE: [facility] - ECHO DOPPLER COMPLETE(TRANSTHOR)  - Nov 11 2013 10:01AM   RESULT: Echocardiogram Report  Patient Name:   [name] Date of Exam: 11/11/2013 Medical Rec #:  [id number]                Custom1: Date of Birth:  10-31-1960            Height:       62.0 in Patient Age:    54 years              Weight:       213.0 lb Patient Gender: M                     BSA:          1.96 m??  Indications: Atrial Fib Sonographer:    LTM Referring Phys: DIAMOND, MICHAEL, S  Summary:  1. Left ventricular ejection fraction, by visual estimation, is 35 to  40%.  2. Mildly to moderately decreased global left ventricular systolic  function.  3. Impaired  relaxation pattern of LV diastolic filling.  4. Moderate concentric left ventricular hypertrophy.  5. Moderately increased left ventricular septal thickness.  6. Mild mitral valve regurgitation.  7. Moderately increased left ventricular posterior wall thickness.  8. Mild tricuspid regurgitation. 2D AND M-MODE MEASUREMENTS (normal ranges within parentheses): Left Ventricle:          Normal   AoV Cusp Separation: 1.30 cm (1.5-2.6) IVSd (2D):      1.71 cm (0.7-1.1) LVPWd (2D):     1.48 cm (0.7-1.1) Aorta/LA:                  Normal LVIDd (2D):     4.18 cm (3.4-5.7) Aortic Root (2D): 3.10 cm (2.4-3.7) LVIDs (2D):     3.75 cm           AoV Cusp Exc:     1.30 cm (1.5-2.6) LV FS (2D):     10.3 %   (>25%)   Left Atrium (2D): 3.20 cm (  1.9-4.0) LV EF (2D):     22.7 %   (>50%) LVIDd (Mmode):  5.25 cm (3.4-5.7) LVIDs (Mmode):  4.25 cm           Right Ventricle: LV FS (Mmode):  19.0 %   (>25%)   RVd (2D): LV EF (Mmode):  39.0 %   (>41%) LV DIASTOLIC FUNCTION: MV Peak E: 0.92 m/s E/e' Ratio: 11.30                     Decel Time: 190 msec SPECTRAL DOPPLER ANALYSIS (where applicable): Mitral Valve: MV P1/2 Time: 54.96 msec MV Area, PHT: 4.00 cm?? Aortic Valve: AoV Max Vel: 0.95 m/s AoV Peak PG: 3.6 mmHg AoV Mean PG: LVOT Vmax: 0.43 m/s LVOT VTI:  LVOT Diameter: 1.80 cm AoV Area, Vmax: 1.15 cm?? AoV Area, VTI:  AoV Area, Vmn: Tricuspid Valve and PA/RV Systolic Pressure: TR Max Velocity: 1.59 m/s RA  Pressure: 10 mmHg RVSP/PASP: 20.1 mmHg  PHYSICIAN INTERPRETATION: Left Ventricle: The left ventricular internal cavity size was normal. LV  septal wall thickness was moderately increased. LV posterior wall  thickness was moderately increased. Moderate concentric left ventricular  hypertrophy. Global LV systolic function was mildly to moderately  decreased. Left ventricular ejection fraction, by visual estimation, is  35 to 40%. Spectral Doppler shows impaired relaxation pattern of LV   diastolic  filling. Right Ventricle: The right ventricular size is normal. Global RV systolic  function is mildly reduced. Left Atrium: The left atrium is normal in size. Right Atrium: The right atrium is normal in size. Pericardium: There is no evidence of pericardial effusion. Mitral Valve: The mitral valve is normal in structure. Mild mitral valve  regurgitation is seen. Tricuspid Valve: The tricuspid valve is normal. Mild tricuspid  regurgitation is visualized. The tricuspid regurgitant velocity is 1.59  m/s, and with an assumed right atrial pressure of 10 mmHg, the estimated  right ventricularsystolic pressure is normal at 20.1 mmHg. Aortic Valve: The aortic valve is normal. No evidence of aortic valve  regurgitation is seen. Pulmonic Valve: The pulmonic valve is normal. [place] MD Electronically signed by [place] MD Signature Date/Time: 11/12/2013/10:38:59 AM  *** Final ***  IMPRESSION: .    Verified By: Yolonda Kida, M.D., MD  Routine Chem:  03-Oct-15 10:13   Glucose, Serum  115  BUN  24  Creatinine (comp) 0.86  Sodium, Serum  128  Potassium, Serum 3.6  Chloride, Serum  81  CO2, Serum  45  Calcium (Total), Serum  8.1  Anion Gap  2  Osmolality (calc) 262  eGFR (African American) >60  eGFR (Non-African American) >60 (eGFR values <23m/min/1.73 m2 may be an indication of chronic kidney disease (CKD). Calculated eGFR, using the MRDR Study equation, is useful in  patients with stable renal function. The eGFR calculation will not be reliable in acutely ill patients when serum creatinine is changing rapidly. It is not useful in patients on dialysis. The eGFR calculation may not be applicable to patients at the low and high extremes of body sizes, pregnant women, and vetetarians.)  Result Comment C02 - RESULTS VERIFIED BY REPEAT TESTING.  - CRITICAL VALUE PREVIOUSLY NOTIFIED.  Result(s) reported on 11 Nov 2013 at 10:10AM.   Radiology  Results: XRay:    29-Sep-15 22:31, Chest Portable Single View  Chest Portable Single View   REASON FOR EXAM:    SOB  COMMENTS:       PROCEDURE: DXR - DXR PORTABLE CHEST  SINGLE VIEW  - Nov 07 2013 10:31PM     CLINICAL DATA:  Shortness of breath for a few days. Patient. Lasix  of few days ago. History of COPD, CHF, hypertension, liver disease.    EXAM:  PORTABLE CHEST - 1 VIEW    COMPARISON:  03/09/2013    FINDINGS:  Shallow inspiration. Cardiac enlargement with increased pulmonary  vascularity suggesting vascular congestion. No edema or  consolidation. Atelectasis in the lung bases. Suggestion of blunting  of costophrenic angles probably indicating small effusions. No  pneumothorax. Postoperative changes in the cervical spine.     IMPRESSION:  The cardiac enlargement with pulmonary vascular congestion. No  edema. Atelectasis in the lung bases. Small pleural effusions.      Electronically Signed    By: Lucienne Capers M.D.    On: 11/07/2013 22:52       Verified By: Neale Burly, M.D.,    29-Sep-15 22:31, Wrist Right Complete  Wrist Right Complete   REASON FOR EXAM:    pain s/p fall  COMMENTS:   Bedside (portable):Y    PROCEDURE: DXR - DXR WRIST RT COMP WITH OBLIQUES  - Nov 07 2013 10:31PM     CLINICAL DATA:  Right wrist pain after fall.    EXAM:  RIGHT WRIST - COMPLETE 3+ VIEW    COMPARISON:  None.    FINDINGS:  There is an acute comminuted impacted distal radius fracture. On the  lateral view, there is apex volar angulation of the fracture site.  Carpals remaining aligned with the distal radius. Probable avulsion  of the ulnar styloid tip,evidenced by 2 mm bony density separated  from the ulna styloid. There is marked soft tissue swelling about  the distal forearm.     IMPRESSION:  Acute, impacted and comminuted distal radius fracture. Apex volar  angulation at the fracture site. Marked adjacent soft tissue  swelling.    Probable avulsion fracture  of ulna styloid tip.      Electronically Signed    By: Curlene Dolphin M.D.    On: 11/07/2013 22:54         Verified By: Sheppard Evens, M.D.,    01-Oct-15 06:22, Chest Portable Single View  Chest Portable Single View   REASON FOR EXAM:    respiratory distress  COMMENTS:       PROCEDURE: DXR - DXR PORTABLE CHEST SINGLE VIEW  - Nov 09 2013  6:22AM     CLINICAL DATA:  Respiratory distress    EXAM:  PORTABLE CHEST - 1 VIEW    COMPARISON:  11/07/2013    FINDINGS:  Cardiac shadow remains enlarged. The degree of vascular congestion  is stable minimal left basilar atelectasis is noted. Some clearing  of the right lung base is noted. No acute bony abnormality is noted.  Cervical spine surgery is again noted.     IMPRESSION:  Slight clearing in the right lung base. The remainder of the exam is  stable from the previous study.      Electronically Signed    By: Inez Catalina M.D.    On: 11/09/2013 07:58         Verified By: Everlene Farrier, M.D.,    01-Oct-15 12:00, Chest Portable Single View  Chest Portable Single View   REASON FOR EXAM:    PICC line placement  COMMENTS:       PROCEDURE: DXR - DXR PORTABLE CHEST SINGLE VIEW  - Nov 09 2013 12:00PM  CLINICAL DATA:  PICC placement.    EXAM:  PORTABLE CHEST - 1 VIEW    COMPARISON:  Single view of the chest 11/09/2013.    FINDINGS:  A new left PICC is in place with the tip projecting deep in the  right atrium. Withdrawal of 7-7.5 cm is recommended. There is  cardiomegaly. There has been marked worsening of airspace disease in  the right lung base. Mild left basilaratelectasis is noted.     IMPRESSION:  Tip of left PICC projects deep in the right atrium. Recommend  withdrawal of 7.0 - 7.5 cm.    Increased right basilar airspace disease could be due to atelectasis  or pneumonia.      Electronically Signed    [id number]  Dalessio M.D.    On: 11/09/2013 12:38     Verified By: Ramond Dial, M.D.,     01-Oct-15 12:18, Chest Portable Single View  Chest Portable Single View   REASON FOR EXAM:    repositioning/withdrawal of picc line  COMMENTS:       PROCEDURE: DXR - DXR PORTABLE CHEST SINGLE VIEW  - Nov 09 2013 12:18PM     CLINICAL DATA:  Repositioning of recently placed central catheter    EXAM:  PORTABLE CHEST - 1 VIEW    COMPARISON:  Study obtained earlier in the day    FINDINGS:  The central catheter tip is in the region of the right atrium,  approximately 5 cm distal to the cavoatrial junction. No  pneumothorax. There is scarring in the right base region, stable.  There is patchy atelectasis in the left base, stable. No new  opacity. Heart is enlarged with pulmonary vascularity within normal  limits. No adenopathy. There is postoperative change in the cervical  spine region.     IMPRESSION:  Central catheter tip remains in right atrium. No pneumothorax.  Scarring right base. Stable atelectasis left base. Stable cardiac  enlargement.      Electronically Signed    By: Lowella Grip M.D.    On: 11/09/2013 12:40     Verified By: Leafy Kindle. Jasmine December, M.D.,    01-Oct-15 12:25, Chest Portable Single View  Chest Portable Single View   REASON FOR EXAM:    reassess PICC Line Placement  COMMENTS:       PROCEDURE: DXR - DXR PORTABLE CHEST SINGLE VIEW  - Nov 09 2013 12:25PM     CLINICAL DATA:  PICC line placement.    EXAM:  PORTABLE CHEST - 1 VIEW    COMPARISON:  11/09/2013 at 12:16 p.m.    FINDINGS:  The left PICC line has been retracted and its tip is now at the  cavoatrial junction.  Stably enlarged cardiopericardial silhouette. Stable obscuration the  right hemidiaphragm with mild hemidiaphragmatic elevation. Lower  cervical plate and screw fixator.     IMPRESSION:  1. The PICC line has been retracted and its tip is now at the  cavoatrial junction. Otherwise stable.      Electronically Signed    By: Sherryl Barters M.D.    On: 11/09/2013 12:53          Verified By: Carron Curie, M.D.,  Cardiology:    29-Sep-15 22:05, ED ECG  Ventricular Rate 92  Atrial Rate 92  P-R Interval 180  QRS Duration 86  QT 352  QTc 435  P Axis 52  R Axis 10  T Axis 58  ECG interpretation   Normal sinus rhythm with sinus  arrhythmia  Normal ECG  When compared with ECG of 06-Mar-2013 04:27,  No significant change was found  ----------unconfirmed----------  Confirmed by OVERREAD, NOT ([id number]), editor PEARSON, BARBARA ([id number]) on 11/08/2013 10:45:20 AM  ED ECG     30-Sep-15 11:50, ECG  Ventricular Rate 102  Atrial Rate 102  P-R Interval 160  QRS Duration 84  QT 348  QTc 453  P Axis 50  R Axis 13  T Axis 44  ECG interpretation   Sinus tachycardia  Possible Left atrial enlargement  Borderline ECG  When compared with ECG of 07-Nov-2013 22:05,  No significant change was found  Confirmed by PARASCHOS, ALEX ([id number]) on 11/08/2013 4:09:40 PM    Overreader: PARASCHOS, ALEX  ECG     03-Oct-15 10:01, Echo Doppler  Echo Doppler   REASON FOR EXAM:      COMMENTS:       PROCEDURE: [place] - ECHO DOPPLER COMPLETE(TRANSTHOR)  - Nov 11 2013 10:01AM     RESULT: Echocardiogram Report    Patient Name:   [name] Date of Exam: 11/11/2013  Medical Rec #:  [id number]                Custom1:  Date of Birth:  Nov 04, 1960            Height:       62.0 in  Patient Age:    60 years              Weight:       213.0 lb  Patient Gender: M                     BSA:          1.96 m??    Indications: Atrial Fib  Sonographer:    LTM  Referring Phys: DIAMOND, MICHAEL, S    Summary:   1. Left ventricular ejection fraction, by visual estimation, is 35 to   40%.   2. Mildly to moderately decreased global left ventricular systolic   function.   3. Impaired relaxation pattern of LV diastolic filling.   4. Moderate concentric left ventricular hypertrophy.   5. Moderately increased left ventricular septal thickness.   6. Mild mitral valve regurgitation.   7.  Moderately increased left ventricular posterior wall thickness.   8. Mild tricuspid regurgitation.  2D AND M-MODE MEASUREMENTS (normal ranges within parentheses):  Left Ventricle:          Normal   AoV Cusp Separation: 1.30 cm (1.5-2.6)  IVSd (2D):      1.71 cm (0.7-1.1)  LVPWd (2D):     1.48 cm (0.7-1.1) Aorta/LA:                  Normal  LVIDd (2D):     4.18 cm (3.4-5.7) Aortic Root (2D): 3.10 cm (2.4-3.7)  LVIDs (2D):     3.75 cm           AoV Cusp Exc:     1.30 cm (1.5-2.6)  LV FS (2D):     10.3 %   (>25%)   Left Atrium (2D): 3.20 cm (1.9-4.0)  LV EF (2D):     22.7 %   (>50%)  LVIDd (Mmode):  5.25 cm (3.4-5.7)  LVIDs (Mmode):  4.25 cm           Right Ventricle:  LV FS (Mmode):  19.0 %   (>25%)   RVd (2D):  LV EF (Mmode):  39.0 %   (>  98%)  LV DIASTOLIC FUNCTION:  MV Peak E: 0.92 m/s E/e' Ratio: 11.30                      Decel Time: 190 msec  SPECTRAL DOPPLER ANALYSIS (where applicable):  Mitral Valve:  MV P1/2 Time: 54.96 msec  MV Area, PHT: 4.00 cm??  Aortic Valve: AoV Max Vel: 0.95 m/s AoV Peak PG: 3.6 mmHg AoV Mean PG:  LVOT Vmax: 0.43 m/s LVOT VTI:  LVOT Diameter: 1.80 cm  AoV Area, Vmax: 1.15 cm?? AoV Area, VTI:  AoV Area, Vmn:  Tricuspid Valve and PA/RV Systolic Pressure: TR Max Velocity: 1.59 m/s RA   Pressure: 10 mmHg RVSP/PASP: 20.1 mmHg    PHYSICIAN INTERPRETATION:  Left Ventricle: The left ventricular internal cavity size was normal. LV   septal wall thickness was moderately increased. LV posterior wall   thickness was moderately increased. Moderate concentric left ventricular   hypertrophy. Global LV systolic function was mildly to moderately   decreased. Left ventricular ejection fraction, by visual estimation, is   35 to 40%. Spectral Doppler shows impaired relaxation pattern of LV     diastolic filling.  Right Ventricle: The right ventricular size is normal. Global RV systolic   function is mildly reduced.  Left Atrium: The left atrium is normal in  size.  Right Atrium: The right atrium is normal in size.  Pericardium: There is no evidence of pericardial effusion.  Mitral Valve: The mitral valve is normal in structure. Mild mitral valve   regurgitation is seen.  Tricuspid Valve: The tricuspid valve is normal. Mild tricuspid   regurgitation is visualized. The tricuspid regurgitant velocity is 1.59   m/s, and with an assumed right atrial pressure of 10 mmHg, the estimated   right ventricularsystolic pressure is normal at 20.1 mmHg.  Aortic Valve: The aortic valve is normal. No evidence of aortic valve   regurgitation is seen.  Pulmonic Valve: The pulmonic valve is normal.  [place] MD  Electronically signed by [place] MD  Signature Date/Time: 11/12/2013/10:38:59 AM    *** Final ***    IMPRESSION: .        Verified By: Yolonda Kida, M.D., MD   Assessment/Plan:  Assessment/Plan:  Assessment IMP AFIB SOB HTN ETOH abuse HypoNa .   Plan PLAN Recommend rate control for atrial fibrillation  I do not recommend anticoagulation and platelets of poor candidate  advised patient to refrain from our call consumption  continued to correct hyponatremia  agree with orthopedic to improved 4 fractured arm  supplemental oxygen therapy is necessary  social service consult for alcoholism  recommend rehab   Electronic Signatures: Lujean Amel D (MD)  (Signed 05-Oct-15 13:08)  Authored: Chief Complaint, VITAL SIGNS/ANCILLARY NOTES, Brief Assessment, Lab Results, Radiology Results, Assessment/Plan   Last Updated: 05-Oct-15 13:08 by Lujean Amel D (MD)

## 2014-06-02 NOTE — Consult Note (Signed)
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 Cardiology consult for CHF, diuretic management in setting of hyponatremia.   He was last seen by Dr. Mariah Milling in 04/2011. He has a h/o significant EtOH intake (eight 24 oz beers/day). He is disabled.  He has a h/o jaundice in 2013, diagnosed with alcoholic hepatitis. No recent skin, scleral discoloration. Despite that, he now reports drinking less (3-4 days/week, two-three 24 oz beers). Smokes 8 cigs/day. He has not followed up in some time.   He ran out of Lasix two weeks ago. He had been feeling well, but then developed progressive resting dyspnea last Friday/Saturday. He endorsed cough productive of clear sputum. He continued to drink. He does note chest tightness associated w/ dyspnea. No exertional chest discomfort. Does not endorse PND, LE edema. Does not weigh daily. He has chronic orthopnea. He symptoms continued to worsen, and he presented to the ED for further evaluation.   Present Illness There, BNP returned at 3800. Na markedly low at 109. EKG showes sinus tach, LAD, borderline LVH. WBC 21.8K. TnI WNL. He was admitted by medicine, gently diuresed. Nephrology was consulted. HypoNa related to beer potomania, hypervolemia. Stared on 3% saline. Started on emp Levaquin, steroids, inhalers. Na 124 today. I/O -3.3 L. Breathing improved, still w/ cough productive of clear sputum.  2D echo showed EF 50-55%, borderline concentric LVH, severe posterior LVH, LA ULN, poor quality, unable to exclude WMAs.  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  PAST SURGICAL  HISTORY: Neck surgery.  ALLERGIES: No known drug allergies.   PSYCHOSOCIAL HISTORY: Lives at home with partner. Smokes less than 1 pack a day. Drinks alcohol heavily, at least 5 times in a week. Denies any illicit drug usage.  FAMILY HISTORY: Father died of lung cancer at age 56. Mother died from cirrhosis at age 75.   Physical Exam:  GEN no acute distress, obese   HEENT pink conjunctivae, PERRL, hearing intact to voice, sclera non-discolored   NECK supple  No masses  trachea midline  redundant neck tissue/body habitus limit JVP estimation, no bruits   RESP normal resp effort  no use of accessory muscles  scattered exp wheezing, rhonch which clears w/ cough, no rales   CARD Regular rate and rhythm  Tachycardic  Normal, S1, S2  No murmur   ABD soft  normal BS   EXTR negative cyanosis/clubbing, negative edema   SKIN normal to palpation, No rashes   NEURO L side weakness, motor/sensory function intact   PSYCH alert, A+O to time, place, person   Review of Systems:  Subjective/Chief Complaint SOB   Respiratory: Frequent cough  Short of breath  Sputum   Cardiovascular: Tightness  Dyspnea  Orthopnea   Review of Systems: All other systems were reviewed and found to be negative   Home Medications: Medication Instructions Status  levofloxacin 500 mg oral tablet 1 tab(s) orally once a day Active  Klor-Con 10 10 mEq oral tablet, extended release 1 tab(s) orally once a day Active  furosemide 40 mg oral tablet 1 tab(s) orally once a day Active  carvedilol 12.5 mg oral tablet 1 tab(s) orally 2 times  a day Active  predniSONE 10 mg oral tablet 3 tabs day1, 2 tabs day2,3, 1 tab day4,5, 1/2 tab day 6,7 Active  Requip 1 mg oral tablet  Active  Advair Diskus 250 mcg-50 mcg inhalation powder 1 puff(s) inhaled 2 times a day Active  tiotropium 18 mcg inhalation capsule 1 cap(s) inhaled once a day Active  magnesium oxide 400 mg (241.3 mg elemental magnesium) oral tablet 1 tab(s) orally once  a day Active  cyanocobalamin 1000 mcg oral tablet 1 tab(s) orally once a day Active  lactulose 10 g/15 mL oral syrup 15 milliliter(s) orally 2 times a day Active  sertraline 25 mg oral tablet 1 tab(s) orally once a day Active  pantoprazole 40 mg oral delayed release tablet 1 tab(s) orally 2 times a day Active  Combivent 18 mcg-103 mcg-/inh inhalation aerosol 2 puff(s) inhaled every 6 hours, As Needed- for Shortness of Breath  Active   Lab Results:  Routine Chem:  26-Jan-15 04:33   Sodium, Serum  107  B-Type Natriuretic Peptide [facility])  3819 (Result(s) reported on 06 Mar 2013 at 04:56AM.)    09:03   Sodium, Serum  109    16:26   Sodium, Serum  112    22:00   Sodium, Serum  115  27-Jan-15 02:00   Sodium, Serum  117    06:02   Sodium, Serum  121  28-Jan-15 03:40   Sodium, Serum  124  Cardiac:  26-Jan-15 04:33   CK, Total 42  CPK-MB, Serum  4.0 (Result(s) reported on 06 Mar 2013 at 08:46AM.)  Troponin I < 0.02 (0.00-0.05 0.05 ng/mL or less: NEGATIVE  Repeat testing in 3-6 hrs  if clinically indicated. >0.05 ng/mL: POTENTIAL  MYOCARDIAL INJURY. Repeat  testing in 3-6 hrs if  clinically indicated. NOTE: An increase or decrease  of 30% or more on serial  testing suggests a  clinically important change)    09:03   CK, Total 37  CPK-MB, Serum 3.4 (Result(s) reported on 06 Mar 2013 at 09:41AM.)  Troponin I < 0.02 (0.00-0.05 0.05 ng/mL or less: NEGATIVE  Repeat testing in 3-6 hrs  if clinically indicated. >0.05 ng/mL: POTENTIAL  MYOCARDIAL INJURY. Repeat  testing in 3-6 hrs if  clinically indicated. NOTE: An increase or decrease  of 30% or more on serial  testing suggests a  clinically important change)    12:30   CK, Total 40  CPK-MB, Serum 3.3 (Result(s) reported on 06 Mar 2013 at 12:58PM.)   EKG:  Interpretation sinus tachycardia, LAD, borderline LVH, no ST/T changes   Rate 102   Radiology Results: Cardiology:    27-Jan-15 14:05, Echo Doppler  Echo Doppler    REASON FOR EXAM:      COMMENTS:       [facility]  - Mar 07 2013  2:05PM     RESULT: Echocardiogram Report    Patient Name:   [name] Date of Exam: 03/07/2013  Medical Rec #:  [id number]                Custom1:  Date of Birth:  December 27, 1960            Height:       62.0 in  Patient Age:    52 years              Weight:       218.0 lb  Patient Gender: M  BSA:          1.98 m??    Indications: CHF  Sonographer:    Cristela Blue RDCS  Referring Phys: Ramonita Lab    Sonographer Comments: Technically very difficult study due to very poor   echo windows.    Summary:  After ruling out all contraindications,The image enhancement agent   Definity was used for this study.   1. Left ventricular ejection fraction, by visual estimation, is 50 to   55%.   2. Low normal global left ventricular systolic function.   3. Borderline concentric left ventricular hypertrophy.   4. Mildly increased left ventricular septal thickness.   5. Upper Limit ofnormal left atrium.   6. Severely increased left ventricular posterior wall thickness.   7. Poor quality images --Technically difficult study.   8. Insufficient visualization to comment on regional wall motion.   9. NO apical thrombus. Cannot excludeapical anteroseptal hypokinesis.  2D AND M-MODE MEASUREMENTS (normal ranges within parentheses):  Left Ventricle:          Normal  IVSd (2D):      1.21 cm (0.7-1.1)  LVPWd (2D):     1.29 cm (0.7-1.1) Aorta/LA:                  Normal  LVIDd (2D):4.68 cm (3.4-5.7) Aortic Root (2D): 2.90 cm (2.4-3.7)  LVIDs (2D):     3.67 cm           Left Atrium (2D): 4.00 cm (1.9-4.0)  LV FS (2D):     21.6 %   (>25%)  LV EF (2D):     43.8 %   (>50%)                                    Right Ventricle:                             RVd (2D):        2.84 cm  SPECTRAL DOPPLER ANALYSIS (where applicable):  Aortic Valve: AoV Max Vel: 1.34 m/s AoV Peak PG: 7.2 mmHg  AoV Mean PG:  LVOT Vmax: 0.92 m/s LVOT VTI:  LVOT Diameter: 2.10 cm  AoV Area, Vmax: 2.37 cm?? AoV Area, VTI:  AoV Area, Vmn:  Tricuspid Valve and PA/RV Systolic Pressure: TR Max Velocity: 1.40 m/s RA   Pressure: 5 mmHg RVSP/PASP: 12.8 mmHg  Pulmonic Valve:  PV Max Velocity: 0.88 m/s PV Max PG: 3.1 mmHg PV Mean PG:    PHYSICIAN INTERPRETATION:  Left Ventricle: Definity contrast agent was given IV to delineate the   left ventricular endocardial borders. The left ventricular internal   cavity size was normal. LV septal wall thickness was mildly increased. LV   posterior wall thickness was severely increased. Borderline concentric   left ventricular hypertrophy. The left ventricular hypertrophy involves   Concentric Remodeling walls. Global LV systolic function was low normal.   Left ventricular ejection fraction, by visual estimation, is 50 to 55%.     LV Diastology was not fully evaluated. NO apical thrombus. Cannot exclude   apical anteroseptal hypokinesis.  Right Ventricle: The right ventricular size is normal. RV wall thickness   is normal. Global RV systolic function is normal.Moderator Band.  Left Atrium: The left atrium was not well visualized. The left atrium is   Upper Limit of normal.  Right Atrium: Poorly visualized. No  gross abnormalities noted.  Tricuspid Valve: The tricuspid regurgitant velocity is 1.40 m/s, and with   an assumed right atrial pressure of 5 mmHg, the estimated right   ventricular systolic pressure is normal at 12.8 mmHg.    [id number] Bryan Lemma  Electronically signed by [id number] Bryan Lemma  Signature Date/Time: 03/08/2013/6:52:54 AM  *** Final ***    IMPRESSION: .        Verified By: Piedad Climes. HARDING, M.D.,    No Known Allergies:   Vital Signs/Nurse's Notes: **Vital Signs.:   28-Jan-15 05:34  Celsius 98.2  Pulse Pulse 106  Respirations Respirations 20  Systolic BP Systolic BP 149  Diastolic BP (mmHg) Diastolic BP (mmHg) 105  Mean BP 119   Pulse Ox % Pulse Ox % 97  Oxygen Delivery 2L  *Intake and Output.:   Daily 28-Jan-15 07:00  Grand Totals Intake:  1490 Output:  2475    Net:  -985 24 Hr.:  -985  Oral Intake      In:  1240  IV (Secondary)      In:  100  IV (Secondary)      In:  150  Urine ml     Out:  2475  Length of Stay Totals Intake:  2395 Output:  5725    Net:  -3330    Impression 54yo male w/ PMHx s/f EtOH abuse, 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   1. Acute respiratory failure, multifactorial 2/2 acute on chronic diastolic CHF, COPD exacerbation, ? OSA/OHS underlying. Breathing improved. Continues to cough w/ clear sputum production. Nearly euvolemic.  -- COPD management per primary team, follow-up PCP post-discharge  2. Acute on chronic diastolic CHF EF 50-55% on echo this admission. Previous EF <25% in 2013. EtOH cardiomyopathy in the past. Hypervolemic from lack of Lasix x 2 weeks and ongoing significant EtOH abuse. He has not followed up w/ [place] since 2013. I/O - 3.3 L after gentle IV diuresis. Na+ near normal. Nearly euvolemic on exam. Breathing improved. Suspect chest tightness due to CHF, dyspnea; non-exertional, however does have RFs and no prior ischemic work-up. No obj evidence of ischemia on EKG, per CEs. -- EtOH wean->cessation -- Restart home dose Lasix, supplement w/ KCl. Make sure has refills and fills prior to running out.  -- Adhere to recommendations per nephrology re: sodium (hyponatremia likely due to beer potomania) -- Needs CHF education: daily weights, symptoms monitoring, dietary recs per nephrology (? sodium restriction appropriate given hypoNa) -- Transitional care follow-up w/ [place] within 7 days. Consider outpatient stress test once improved clinically.   3. Hyponatremia 2/2 beer potomania, hypervolemia. Na 124 this AM. Improved, managed by nephrology.  -- Further recs  by nephrology  4. COPD exacerbation Persistent cough productive of clear sputum.  -- Management per primary team -- Tobacco cessation  5. Hypertension Normotensive for the most part this admission. -- Could transition to more B1 selective BB due to underlying COPD   Plan 6. Ongoing tobacco abuse -- Cessation strongly advised -- Offer nicotine replacement therapy as a means of cessation assistance  7. Obesity -- Diet & lifestyle modifications as a means of weight loss and RF reduction.   Electronic Signatures for Addendum Section:  Marykay Lex (MD) (Signed Addendum 28-Jan-15 13:58)  I saw & examined the patient along with Mr. Francee Gentile.  I agree with his history, exam, findings & recommendations.  H/o  NICM - likley EtOH related (EF notably improved on current study - unable to get good read on diastolic function), EtOH abuse, HTN, COPD . Marland Kitchen Marland Kitchen admitted for A on C HF & COPD -- ran out of Lasix & not eating well with + EtOH --> Hyponatremic (combination hypervolemic & reset osmostat - ? beer potomania) -- manage per Nephrology. Breathing notably improved with IV diuretic.  Will need to be back on home regimen. Needs education re: hyponatremia & the dangeroous condidiion he was in. I agree that he seems close to euvolemic currently -- needs to increase PO intacke thelp increase blood solute to redistibute 3rd spaced fluid.  Should be fine on prior home dose Lasix -- need to set up TCM f/u with Dr. Mariah Milling.   Electronic Signatures: Gery Pray (PA-C)  (Signed 28-Jan-15 09:31)  Authored: General Aspect/Present Illness, History and Physical Exam, Review of System, Home Medications, Labs, EKG , Radiology, Allergies, Vital Signs/Nurse's Notes, Impression/Plan Marykay Lex (MD)  (Signed 28-Jan-15 13:58)  Co-Signer: General Aspect/Present Illness, History and Physical Exam, Review of System, Home Medications, Labs, EKG , Radiology, Allergies, Vital Signs/Nurse's Notes,  Impression/Plan   Last Updated: 28-Jan-15 13:58 by Marykay Lex (MD)

## 2014-06-02 NOTE — H&P (Signed)
PATIENT NAME:  [name], [name] MR#:  [id number] DATE OF BIRTH:  Oct 20, 1960  DATE OF ADMISSION:  11/17/2013  CHIEF COMPLAINT:  Hypotension.   HISTORY OF PRESENT ILLNESS: [name] is a 54 year old obese Caucasian gentleman, who is well known to our service from previous admission, just discharged October 5, comes in for his right distal radial fracture surgery; however, he was found to have blood pressure of 64/45 in the PACU. Internal medicine was consulted for hypotension, and the patient's surgery was canceled. The patient denied any complaints of chest pain. He has shortness of breath which is chronic and he uses 3 liters of oxygen at home. Denies any use of alcohol or any other medications. He had held his blood pressure medications the night before and in the morning. He is currently asymptomatic. The patient received a small bolus of IV fluids, about 25 mL of IV fluids. He is being admitted for further evaluation and management of his acute hypotension.   PAST MEDICAL HISTORY:   1. COPD on chronic home oxygen with chronic respiratory failure.  2. Chronic hyponatremia.  3. Chronic contraction alkalosis.  4. CKD stage III.  5. Alcohol abuse.  6. Tobacco abuse.  7. Cardiomyopathy with EF of 35%.  8. New-onset atrial fibrillation.  9. 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   SOCIAL HISTORY: Lives at home. He does have a history of smoking. He also has history of drinking; however, the patient states he has stopped since his last admission.   FAMILY HISTORY: Hypertension.   HOME MEDICATIONS: 1. Magnesium 400 mg p.o. daily.  2. Lisinopril 5 mg daily.  3. Lasix 40 mg daily.  4. Folic acid 1 mg daily.  5. Combivent 2 puffs every 6 hours.  6. Carvedilol 12.5 mg b.i.d.  7. Advair 250/50 one puff b.i.d.  8. Acetazolamide 250 mg b.i.d.  9. Acetaminophen/oxycodone 5/325 one every 6 hours as needed.   ALLERGIES: No known drug allergies.   REVIEW OF SYSTEMS:  CONSTITUTIONAL: No  fever, fatigue, weakness.  EYES: No blurred or double vision, or glaucoma or cataract.  ENT: No tinnitus, ear pain, hearing loss.  RESPIRATORY: Positive for chronic respiratory failure with COPD. No hemoptysis or cough.  CARDIOVASCULAR: No chest pain, orthopnea. Positive for hypotension, positive for atrial fibrillation.  GASTROINTESTINAL: No nausea, vomiting, diarrhea, abdominal pain or melena.  GENITOURINARY: No dysuria, hematuria or frequency.  ENDOCRINE: No polyuria, nocturia or thyroid problems.  HEMATOLOGY: No anemia or easy bruising.  SKIN: No acne or rash.  MUSCULOSKELETAL: Positive for pain in the right hand, no swelling or gout. NEUROLOGIC: No CVA, TIA, dysarthria or dementia.  PSYCHIATRIC: No anxiety or depression.  All other systems reviewed are negative.   PHYSICAL EXAMINATION:  GENERAL: The patient is awake, alert, oriented x 3, not in acute distress. Morbidly obese. VITAL SIGNS: Afebrile, pulse is 117, blood pressure is 92/64, saturations 100% on 3 liters.  HEENT: Atraumatic, normocephalic. PERRLA. EOM intact. Oral mucosa is moist.  NECK: Supple. No JVD. No carotid bruits.  RESPIRATORY: Clear to auscultation bilaterally. Decreased breath sounds at the bases. No respiratory distress or labored breathing.  CARDIOVASCULAR: Tachycardia present. Irregularly irregular heart rhythm. No murmur heard. PMI not lateralized. Chest nontender.  EXTREMITIES: Feeble pedal pulses secondary to edema, along with good femoral pulses. Has 2+ pitting edema.  ABDOMEN: Obese, soft, nontender. No organomegaly.  NEUROLOGIC: Grossly intact cranial nerves II through XII. No motor or sensory deficit. PSYCHIATRIC: The patient is awake, alert, oriented x 3.  LABORATORIES AND DIAGNOSTIC DATA: EKG shows atrial fibrillation with RVR.   Laboratories done on October 10, white count is 7.6, hemoglobin and hematocrit is 12.9 and 40.9, platelet count is 128,000, MCV is 104. Glucose is 76, BUN 62, creatinine is  1.08, sodium is 142, potassium is 4.2, chloride is 103, bicarbonate is 32.   ASSESSMENT: [age]-year-old [name] with chronic multiple medical problems, is admitted from the PACU for:   1. Acute hypotension. The patient appears asymptomatic. Etiology unclear at this time. The patient has been off his blood pressure medications. He was given IV bolus. The patient's blood pressure is improving. We will admit him on the telemetry floor. Hold off on his Coreg. Continue IV fluid maintenance at 75 mL/hour to avoid the patient going into pulmonary edema/congestive heart failure.  2. Atrial fibrillation with rapid ventricular response. The patient's blood pressure is on the lower side. Heart rate is in the 120s. We will have cardiology see the patient to see if the patient is a candidate for an antiarrhythmic, likely amiodarone. The case was discussed with Dr. Lady Gary.  3. Chronic obstructive pulmonary disease with chronic respiratory failure on 3-liter nasal cannula oxygen. We will continue nebulizer treatments and oral inhalers. The patient does not seem to have any respiratory distress.  4. Right distal radial fracture. Dr. Ernest Pine to see the patient, and once medically stable, will be operated on.  5. Cirrhosis of liver, new onset, appears stable.  6. History of cardiomyopathy with ejection fraction of 35%. The patient appears to be well compensated at this time. We will continue his Coreg once his blood pressure allows.  7. Deep vein thrombosis prophylaxis with subcutaneous heparin t.i.d.   CODE STATUS: The patient is a Full Code.   The above was discussed with the patient.   TIME SPENT: 55 minutes.    ____________________________ Wylie Hail Allena Katz, MD sap:JT D: 11/19/2013 07:28:05 ET T: 11/19/2013 09:10:55 ET JOB#: [id number]  cc: Chelle Cayton A. Allena Katz, MD, <Dictator> Willow Ora MD ELECTRONICALLY SIGNED 11/27/2013 15:54

## 2014-06-02 NOTE — Consult Note (Signed)
PATIENT NAME:  [name], [name] MR#:  [id number] DATE OF BIRTH:  07-27-1960  DATE OF CONSULTATION:  11/10/2013  REFERRING PHYSICIAN:  Santina Evans P. Clent Ridges, M.D. CONSULTING PHYSICIAN:  Illene Labrador. Angie Fava., MD  CHIEF COMPLAINT: Right wrist pain.   HISTORY OF PRESENT ILLNESS: The patient is a 54 year old right-hand dominant male, who presented to the Emergency Department on the date of admission with complaints of shortness of breath. He apparently fell the previous night and injured his right wrist. Radiographs were apparently obtained in the Emergency Department and a comminuted distal radius fracture was noted. Review of records shows that the patient was splinted in the Emergency Department. However, admitting history and physical by the hospitalist did not document the injury and no orthopedic consultation was placed. The patient was subsequently admitted to CCU with respiratory distress, chronic obstructive pulmonary disease, congestive heart failure and was subsequently treated for alcohol withdrawal. During his stay in the critical care unit the splint was maintained, but no other intervention was performed. On the afternoon when the patient was to be transferred from the CCU to the floor, consultation was placed due to the patient's complaint of right wrist pain.   PAST MEDICAL HISTORY: Chronic obstructive pulmonary disease, congestive heart failure, alcohol abuse, and status post diskectomy.   MEDICATIONS: At the time of admission:  1. Advair 250/50 one puff b.i.d. 2. Carvedilol 12 mg b.i.d. 3. Combivent 18 mcg/103 mcg 2 puffs every 6 hours as needed for shortness of breath. 4. Cyanocobalamin 1000 mcg daily. 5. Lasix 40 mg daily. 6. Klor-Con 10 mEq daily. 7. Lactulose 10 grams per 15 mL 15 mL b.i.d. 8. Levofloxacin 750 mg p.o. daily. 9. Magnesium oxide 400 mg daily. 10. Protonix 40 mg b.i.d. 11. Prednisone 10 mg 60 mg taper. 12. Requip 1 mg daily. 13. Sertraline 25 mg  daily. 14. Tiotropium 18 mcg inhaled daily.   ALLERGIES: No known drug allergies.   SOCIAL HISTORY: The patient does smoke cigarettes and has done so for greater than 25 years. He drinks beer on a regular basis. He denies any listed drug use.   FAMILY HISTORY: Positive for hypertension.   REVIEW OF SYSTEMS: Pertinent review of systems for musculoskeletal, notable for right wrist pain.   PHYSICAL EXAMINATION: GENERAL: Well-developed, well-nourished male seen in no acute distress.  HEENT: Atraumatic, normocephalic. Sclerae are clear. Extraocular motion intact. Oropharynx is clear with moist mucosa.  NECK: Supple, nontender.  LUNGS: Bilateral rhonchi.  CARDIAC: Irregular rate and rhythm. Normal S1, S2. No appreciable murmurs, gallops, rubs.  ABDOMEN: Soft, nontender.  MUSCULOSKELETAL: Notable for findings involving the right wrist. A volar wrist splint is in place. A large amount of swelling is noted to the digits and dorsum of the hand. Good capillary refill is noted. Sensory function is grossly intact. A moderate amount of ecchymosis is noted. There is tenderness to palpation along the distal radius.  NEUROLOGIC: Awake, alert, and oriented. Sensory function is grossly intact. Motor strength is grossly within normal limits with the exception of the right upper extremity, which was not assessed due to the injury. Good motor coordination is noted.   X-RAYS: Radiographs of the right wrist dated November 07, 2013 were reviewed. There is a comminuted displaced fracture of the distal radius. There also appears to be an avulsion type fracture of the ulnar styloid.   IMPRESSION: Right distal radius fracture.   PLAN: Findings were discussed with the patient. Given the displacement and comminution, I have recommended open reduction and internal fixation.  Risks and benefits of surgical intervention were discussed. Given the significant amount of swelling we will continue with ice and elevation. He will  need to be medically optimized before surgery will be considered.    ____________________________ Illene LabradorJames P. Angie FavaHooten Jr., MD jph:hh D: 11/12/2013 21:20:58 ET T: 11/12/2013 22:22:41 ET JOB#: [id number]  cc: Illene LabradorJames P. Angie FavaHooten Jr., MD, <Dictator> Ena Dawleyatherine P. Clent RidgesWalsh, MD Jena GaussJAMES P HOOTEN JR MD ELECTRONICALLY SIGNED 11/19/2013 9:30

## 2014-06-02 NOTE — Consult Note (Signed)
Brief Consult Note: Diagnosis: right distal radius fracture.   Comments: Patient had been scheduled for ORIF of right distal radius fracture this AM. However, patient was hypotensive and surgery was canceled. Appreciate hospitalist's assistance with stabilizing/optimizing patient in preparation for surgery. Will follow.  Electronic Signatures: Donato HeinzHooten, James P (MD)  (Signed 09-Oct-15 13:13)  Authored: Brief Consult Note   Last Updated: 09-Oct-15 13:13 by Donato HeinzHooten, James P (MD)

## 2014-06-02 NOTE — Discharge Summary (Signed)
PATIENT NAME:  [name], [name] MR#:  [id number] DATE OF BIRTH:  04/05/1960  DATE OF ADMISSION:  11/07/2013 DATE OF DISCHARGE:  11/13/2013  PRESENTING COMPLAINT: Fall and shortness of breath.   DISCHARGE DIAGNOSES:  1. Right distal radius fracture status post fall, work-up as outpatient per Dr. Ernest Pine.  2. Chronic obstructive pulmonary disease flare.  3. Chronic hyponatremia.  4. Chronic contraction alkalosis.  5. Chronic kidney disease stage 3.   6. Alcohol abuse.  7. Tobacco abuse.   CODE STATUS: Full code.   MEDICATIONS:  1. Cyanocobalamin 1000 mcg p.o. daily.  2. Mag-Ox 400 p.o. daily.  3. Lactulose 15 mL b.i.d.  4. Protonix 40 mg b.i.d.  5. Combivent 2 puffs every 6 hours as needed.  6. Spiriva 18 mcg inhalation daily.  7. Advair 250/50 1 puff b.i.d.  8. Requip 1 mg daily.  9. Carvedilol 12.5 mg b.i.d.  10. Lasix 40 mg p.o. daily.  11. Klor-Con 10 mEq p.o. daily.  12. Prednisone taper as indicated.  13. Acetaminophen/oxycodone 5/325 1 tablet every 6 hours as needed.  14. Acetazolamide 250 mg p.o. b.i.d.   DISCHARGE INSTRUCTIONS:  1. Home health and PT has been arranged.  2. Follow up with Dr. Ernest Pine October 6 at 10:15 a.m.  3, Follow up with Dr. Wynelle Link in 1 to 2 weeks.  4. The patient advised to use walker and stop drinking alcohol.   CONSULTATIONS:  1. Dr. Ernest Pine.  2. Cardiology, Dr. Juliann Pares.  3. Nephrology, Dr. Wynelle Link.   LABORATORY DATA AND IMAGING:  Glucose is 94, BUN is 26, creatinine 0.8, sodium 126, chloride is 80, bicarbonate is 43. Echo Doppler showed ejection fraction of 35% to 40%, mild to moderate decreased global left ventricular systolic function. Mild to moderately increased left ventricular septal thickness.   BRIEF SUMMARY OF HOSPITAL COURSE:  1. [name] is a 54 year old obese Caucasian gentleman with multiple medical problems, well known to our service from previous admissions who comes to the Emergency Room after he had shortness of  breath and a fall at home. He was admitted with acute respiratory failure with hypoxia likely combination of mild CHF and COPD exacerbation. Pulmonary consultation was obtained. The patient was kept on Solu-Medrol, changed to p.o. steroid taper, continued on his Advair and Spiriva and received Levaquin empirically. He was improving and at baseline.  2. New-onset atrial fibrillation with RVR. Heart rate improved with Coreg and Cardizem. No anticoagulation per cardiology given recent fall and history of chronic alcoholism. If the patient continues to do better, quits drinking alcohol. we should consider it at a later date. Echo showed EF of 35%.  3. 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 The patient's sodium has been ranging from 120 to 128 since January 2015. It was discussed with Dr. Wynelle Link and since the patient's mentation is stable he was resumed back on his Lasix.  4. Right distal radial fracture increasingly painful. Seen by ortho and plans to do surgery early next week. He will follow up with Dr. Ernest Pine October 6.  5. Restless leg syndrome. Continue Requip.  6. Chronic contraction alkalosis. The patient was started on Diamox. Will follow Dr. Wynelle Link as outpatient. Hospital stay otherwise remained stable.   CODE STATUS: The patient remained a full code.   TIME SPENT: 40 minutes.    ____________________________ Wylie Hail Allena Katz, MD sap:JT D: 11/16/2013 16:14:37 ET T: 11/16/2013 16:24:44 ET JOB#: [id number]  cc: Madalina Rosman A. Allena KatzPatel, MD, <Dictator> Illene LabradorJames P. Angie FavaHooten Jr., MD Laverda SorensonSarath C. Kolluru, MD Dwayne D. Juliann Paresallwood, MD   Willow OraSONA A Danecia Underdown MD ELECTRONICALLY SIGNED 11/22/2013 12:28

## 2014-06-02 NOTE — Consult Note (Signed)
Brief Consult Note: Diagnosis: comminuted displaced right distal radius fracture.   Patient was seen by consultant.   Comments: Apparent fall on date of admission. Splint applied in ER but Orthopaedics was apparently not contacted and no other intervention was undertaken until CCU nurse inquired about "wrist splint" in preparation for transferring patient out of the unit to floor. Significant swelling. Neurologically intact with good cap refill.  Will need ORIF sometime next week when swelling has subsided and medically stable.  Electronic Signatures: Donato HeinzHooten, James P (MD)  (Signed 03-Oct-15 07:55)  Authored: Brief Consult Note   Last Updated: 03-Oct-15 07:55 by Donato HeinzHooten, James P (MD)

## 2014-06-02 NOTE — Consult Note (Signed)
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  He had intermittent atrial fibrillation during that admission and also has a history of COPD.  He presented for surgical correction of his right arm.  In the preoperative area was noted to have significant hypotension.  He also has atrial fibrillation with variable but intermittently rapid ventricular response.  He is relatively asymptomatic from this.  He is not aware of his rapid heart rate.  He does not appear to be in congestive heart failure eat and he is currently ruled out for a myocardia infarction.  His rate has been in limited control.  He was not felt previously to be a candidate for chronic anticoagulation due to fall risk due to ethanol intake and recent falls.  He currently is on IV heparin for his atrial fibrillation.  He denies syncope or presyncope at present but does feel lightheaded when standing up.  His blood pressure has improved minimally since admission.  Systolic blood pressure is currently 90. His heart rate is 120-130.   Physical Exam:  GEN well nourished, no acute distress   HEENT PERRL, moist oral mucosa   NECK supple   RESP normal resp effort   CARD Irregular rate and rhythm  Tachycardic   ABD denies tenderness   LYMPH negative neck   EXTR negative cyanosis/clubbing   SKIN normal to palpation   NEURO cranial nerves intact, motor/sensory function intact   PSYCH A+O to time, place, person   Review of Systems:  Subjective/Chief Complaint right arm fracture and pain otherwise with only mild dizziness when standing up.   General: No Complaints   Skin: No Complaints   ENT: No Complaints   Eyes: No Complaints   Neck: No Complaints   Respiratory: No Complaints   Cardiovascular: No Complaints   Gastrointestinal: No Complaints    Genitourinary: No Complaints   Vascular: No Complaints   Musculoskeletal: Muscle or joint pain   Neurologic: No Complaints   Hematologic: No Complaints   Endocrine: No Complaints   Psychiatric: No Complaints   Review of Systems: All other systems were reviewed and found to be negative   Medications/Allergies Reviewed Medications/Allergies reviewed   Family & Social History:  Family and Social History:  Family History COPD   EKG:  Abnormal NSSTTW changes   Interpretation Atrial fibrillation with variable but fairly rapid ventricular response    No Known Allergies:    Impression 54 year old male with history of alcohol-induced cardiomyopathy with an ejection fraction of 35-40%.  He was admitted after presenting to same-day surgery for consideration for treatment of his right distal radial fracture.  He was noted to be hypertensive and tachycardic in the holding area in the surgery was canceled and the patient admitted.  He is fairly asymptomatic despite relative hypotension.  After mild hydration, his systolic blood pressure is 90. His heart rate is 120 5-130.  This is atrial fibrillation with rapid ventricular response.  He had been on lisinopril as well as carvedilol.  These have both been discontinued due to relative hypotension.  Given his tachycardia would recommend holding these drugs for now and placing him on amiodarone.  Due to his relative stability, will attempt a load orally with 400 mg twice daily.  Will follow his heart rate response as well as his hemodynamics.  He is currently on IV  heparin which I would continue with for now.  Pending surgery would limit chronic anticoagulation decisions at the present time.  Prior to discharge after surgical correction, will need to consider chronic anticoagulation.  This will need to involve extensive discussion with the patient regarding compliance with alcohol cessation and limiting fall risk.  His chads score is 1  for congestive  heart failure.  He has no evidence currently of hypertension.  His age is less than 87, he is not diabetic and has not had a previous stroke.   Plan 1. Will load with amiodarone orally at 1st with 400 mg twice daily 2. Continue IV heparin 3. Defer chronic anticoagulation at present due to pending possible surgical intervention 4. low-sodium diet 5. gentle hydration as you were doing 6. Will follow with you   Electronic Signatures: Dalia Heading (MD)  (Signed 10-Oct-15 11:05)  Authored: General Aspect/Present Illness, History and Physical Exam, Review of System, Family & Social History, EKG , Allergies, Impression/Plan   Last Updated: 10-Oct-15 11:05 by Dalia Heading (MD)

## 2014-06-02 NOTE — H&P (Signed)
PATIENT NAME:  [name], KINCAID MR#:  [id number] DATE OF BIRTH:  Jun 25, 1960  DATE OF ADMISSION:  03/06/2013  PRIMARY CARE PHYSICIAN: Zachery Dakins, MD  REFERRING PHYSICIAN: Dr. Zenda Alpers.   CHIEF COMPLAINT: Shortness of breath.   HISTORY OF PRESENT ILLNESS: The patient is a 54 year old Caucasian male with multiple medical problems including chronic respiratory failure, lives on 2.5 liters of oxygen daily, history of congestive heart failure, COPD, hypertension, chronic liver disease from hepatitis C, and GERD who is presenting to the ER with the chief complaint of shortness of breath. The patient is reporting that he has been short of breath for the past 2 days and has been having difficulty with breathing. The shortness of breath has progressively gotten worse and yesterday night he could not breathe. He called EMS and by the time EMS arrived the patient's pulse ox was at 78% on room air. The patient was immediately placed on CPAP by EMS and brought into the ER. Eventually the patient was placed on BiPAP machine in the ER. The patient was complaining of chest tightness in the EMS people and unable to speak at the time. The patient was diaphoretic and cyanotic and EMS wanted to bring him to the ER. The patient's blood pressure was extremely high. As reported by the patient it was 250/120. They applied nitroglycerin paste to the anterior chest wall and by the time the patient came into the ER blood pressure was 124/89. The patient was diffusely wheezing and gasping for breath. He was placed on BiPAP machine at 60% FiO2. Chest x-ray, single view, has revealed right lower lobe opacity which might be early pneumonia. The patient has received IV antibiotics. The patient's sodium is also extremely low at 107. The patient's mentation was normal. Denies any history of seizures. During my examination, no family members are at bedside. The patient started feeling better on BiPAP. He denies any fever. No sick  contacts. No other complaints. Denies any abdominal pain, nausea, vomiting, or diarrhea.   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  PAST SURGICAL HISTORY: Neck surgery.  ALLERGIES: No known drug allergies.   PSYCHOSOCIAL HISTORY: Lives at home with partner. Smokes less than 1 pack a day. Drinks alcohol heavily, at least 5 times in a week. Denies any illicit drug usage.  FAMILY HISTORY: Father died of lung cancer at age 90. Mother died from cirrhosis at age 66.   HOME MEDICATIONS: Sertraline 25 mg p.o. once daily, Requip 1 mg p.o. frequency and dosing unknown, prednisone tapering dose, Protonix 40 mg once daily, magnesium oxide 400 mg p.o. once daily, lactulose 15 mL p.o. 2 times a day.  REVIEW OF SYSTEMS: CONSTITUTIONAL: Denies any fatigue. Complaining of low-grade fever.  EYES: Denies blurry vision, double vision.  ENT: Denies epistaxis. Complaining of nasal discharge. RESPIRATORY: Productive cough. COPD. Wheezing.  CARDIOVASCULAR: No chest pain. Complaining of chest tightness. Denies palpitations.  GASTROINTESTINAL: Denies nausea, vomiting, or diarrhea.  GENITOURINARY: No dysuria or hematuria. ENDOCRINE: Denies polyuria, nocturia, and thyroid problems.  HEMATOLOGIC AND LYMPHATIC: No anemia, easy bruising, bleeding.  INTEGUMENTARY: No acne, lesion, or rash. Petechiae were noticed on the left upper extremity. MUSCULOSKELETAL: No joint pain in the neck and back.  NEUROLOGIC: No vertigo or ataxia. PSYCH: No ADD or OCD.   PHYSICAL EXAMINATION: VITAL SIGNS: Temperature is  98, pulse 96, respirations 22, blood pressure 152/92, and pulse ox is 96% on BiPAP.  GENERAL APPEARANCE: Not in acute distress. Moderately built and nourished  HEENT: Normocephalic, atraumatic.  Pupils are equally reacting to light and accommodation. No scleral icterus. No conjunctival injection. No sinus tenderness. No postnasal drip.  NECK: Supple. No JVD. No thyromegaly. Range of motion is intact.  LUNGS: Expiratory wheezing is present on BiPAP. Bronchial breath sounds can be heard.  CARDIAC: S1, S2 normal. Regular rate and rhythm. No murmurs.  GASTROINTESTINAL: Soft, obese. Bowel sounds are positive in all 4 quadrants. Nontender, nondistended. No hepatosplenomegaly. No masses felt.  NEUROLOGIC: Awake, alert, and oriented x3. Cranial nerves II through XII are intact. Motor and sensory are intact. Reflexes are 2+. Following all verbal commands.  EXTREMITIES: No edema. No cyanosis. No clubbing.  SKIN: Warm to touch. Normal turgor. No rashes. No lesions.  MUSCULOSKELETAL: No joint effusion, tenderness, or erythema.  PSYCHIATRIC: Flat mood and affect.   LABORATORY AND IMAGING STUDIES: Chest x-ray, portable single view: Right lower lobe opacity. May reflect atelectasis or pneumonia. Small left pleural effusion suspected. Intermittent prominence is at least in part chronic obstructive pulmonary disease. Superimposed atypical viral infection not excluded.   LFTs: Albumin is 3.3, bilirubin 1.7, alkaline phosphatase 145. The rest of the liver LFTs are normal. Troponin less than 0.2. WBC 21.8, hemoglobin 16.6, hematocrit 49, and platelets 199.   Initial pH 7.21, pCO2 99, pO2 101, FiO2 60%, base excess 7.8, and bicarb is 39.6. Repeat. ABG has revealed a pH of 7.28, pCO2 85, pO2 120, and bicarb 39.9 on FiO2 of 60%.   Glucose 177. BNP is 3819. BUN 7, creatinine 0.49, sodium 107, potassium 4.7, chloride 70, CO2 36. GFR greater than 60. Anion gap 6, Serum osmolality 220. Calcium 8.5.   A 12-lead EKG: Sinus tachycardia with premature ventricular complex and fusion complexes bilaterally. No acute ST-T wave changes.   ASSESSMENT AND PLAN: A 54 year old male presenting to the ER with the chief  complaint of worsening of shortness of breath for the past 2 days. Will be admitted with the following assessment and plan:  1.  Acute hypoxic respiratory failure, probably from pneumonia and acute exacerbation of chronic obstructive pulmonary disease. Will admit the patient to CCU. Continue BiPAP. The patient will be given IV Zosyn and levofloxacin. IV Solu-Medrol will be given every 6 hours. DuoNeb nebulizer treatments q. 6 hours will be provided.  2.  Hyponatremia with normal mentation. Probably this hyponatremia could be acute on chronic. Chronic hyponatremia could be from beer potomania as the patient drinks alcohol at least 3 to 4 times in a week. Will monitor serial sodium levels. We will check serum and urine osmolality.  ER physician has called and notified the nephrologist.  3.  Acute exacerbation of chronic obstructive pulmonary disease. Continue Solu-Medrol and nebulizer treatments with DuoNebs.  4.  Chronic liver disease, probably from hepatitis C. Will monitor liver function tests closely.  5.  Prior history of congestive heart failure. Not fluid overloaded at this time.   He is FULL code. His partner is medical power of attorney. We will provide him gastrointestinal and deep vein thrombosis prophylaxis. The diagnosis and plan of care was discussed in detail with the patient. He is aware of the plan. His condition is critical.  TOTAL CRITICAL CARE TIME SPENT: 50 minutes.   ____________________________ Ramonita Lab, MD ag:sb D: 03/06/2013 08:07:27 ET T: 03/06/2013 09:44:23 ET JOB#: [id number]  cc: Ramonita Lab, MD, <Dictator> Marilu Favre  Mayford KnifeWilliams, MD Ramonita LabARUNA Dashiel Bergquist MD ELECTRONICALLY SIGNED 03/19/2013 7:20

## 2014-06-02 NOTE — H&P (Signed)
PATIENT NAME:  SHALIN, VONBARGEN MR#:  [id number] DATE OF BIRTH:  1960/12/24  DATE OF ADMISSION:  11/27/2013  PRIMARY CARE PHYSICIAN: Lucillie Garfinkel. Mayford Knife, MD   REFERRING PHYSICIAN: Darien Ramus, MD  CHIEF COMPLAINT: Shortness of breath. Currently intubated.   HISTORY OF PRESENT ILLNESS: Mr. Parco is a 54 year old who has a history COPD,  continued tobacco abuse, cirrhosis, hypertension, hyperlipidemia, recently had a distal right fracture, status post open reduction and internal fixation. The patient was sent home with oxycodone. The patient was noted to be more confused and lethargic. Concerning this, the patient was brought to the Emergency Department. On workup in the Emergency Department, the patient was found to have a pCO2 of 100. Concerning this, the patient was intubated. Initial pH was 7.0. Unable to obtain any history from the patient. The history is mainly obtained from the previous medical records from recent admission.   PAST MEDICAL HISTORY:  1.  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  2.  Cirrhosis of the liver.  3.  Osteoarthritis.  4.  COPD.  5.  Hypertension.  6.  Chronic alcohol abuse.  7.  Hyperlipidemia.  8.  Neck surgery in the past.  9.  Tobacco abuse.   ALLERGIES: No known drug allergies.   HOME MEDICATIONS:  1.  Oxycodone 5 mg 1-2 tablets every 4-6 hours as needed.  2.  Magnesium oxide 400 mg once a day.  3.  Lasix 40 mg once a day.  4.  Folic acid 1 mg once a day.  5.  Combivent 18 mcg 2 puffs every 6 hours as needed.  6.  Carvedilol 25 mg 2 times a day.  7.  Amiodarone 400 mg 2 times a day.  8.  Percocet 5/325 mg every 6 hours as needed.   SOCIAL HISTORY: Patient as per records smokes and drinks alcohol heavily. No history of any drug use.   FAMILY HISTORY: History of COPD.   REVIEW OF SYSTEMS: Could not be obtained from the patient as the patient is intubated.   PHYSICAL EXAMINATION:  GENERAL: This is an obese male lying down  in the bed, not in distress.  VITAL SIGNS: Temperature 98.6, pulse 72, blood pressure 165/95, respiratory rate,  oxygen saturation 100% on 55% FiO2.  HEENT: Head normocephalic, atraumatic. There is no scleral icterus. Conjunctivae normal. Pupils equal and react to light. Mucous membranes moist. No oropharyngeal erythema.  NECK: Supple. No lymphadenopathy. No JVD. No carotid bruit.  CHEST: No focal tenderness.  LUNGS: Bilaterally clear to auscultation.  HEART: S1, S2 regular. No murmurs are heard.  ABDOMEN: Bowel sounds present. Soft, nontender, nondistended.  EXTREMITIES: No pedal edema. Pulses 2+.  NEUROLOGIC: The patient is not oriented to place, person, and time. No apparent cranial nerve abnormalities. Motor 5/5 in upper and lower extremities.   LABORATORY DATA: Urinalysis negative for nitrites and leukocyte esterase. CBC: WBC 7.8, hemoglobin 12.6. Troponins are negative.   ASSESSMENT AND PLAN: Mr. Faniel is a 54 year old male who comes with chronic obstructive pulmonary disease exacerbation.  1.  Chronic obstructive pulmonary disease exacerbation with respiratory failure: The patient is intubated and will be admitted to the Intensive Care Unit. Continue with the breathing treatments, Solu-Medrol, and treat it as a community-acquired pneumonia.  2.  Right lower lobe pneumonia commented as a combination of consolidation and atelectasis: As mentioned, we will treat as a community-acquired pneumonia with Rocephin and Zithromax.  3.  Hypertension, currently well controlled.  4.  Keep the patient  on deep vein thrombosis prophylaxis with Lovenox.   TIME SPENT: 55 minutes of critical care time.    ____________________________ Susa GriffinsPadmaja Jessikah Dicker, MD pv:ts D: 11/28/2013 00:13:25 ET T: 11/28/2013 00:31:24 ET JOB#: [id number]  cc: Susa GriffinsPadmaja Jerelle Virden, MD, <Dictator> Susa GriffinsPADMAJA Reni Hausner MD ELECTRONICALLY SIGNED 12/09/2013 23:22

## 2014-06-02 NOTE — Consult Note (Signed)
PATIENT NAME:  [name], [name] MR#:  [id number] DATE OF BIRTH:  [birth year]  DATE OF CONSULTATION:  11/10/2013  REFERRING PHYSICIAN:     Santina Evansatherine P. Clent RidgesWalsh, MD  CONSULTING PHYSICIAN:  Tonetta Napoles D. Juliann Paresallwood, MD  PRIMARY CARE PHYSICIAN:  Zachery Dakinslarence Williams, MD  INDICATION:  Respiratory distress.   HISTORY OF PRESENT ILLNESS:  The patient is a 54 year old white male who presented to the Emergency Room via EMS with shortness of breath. The patient stated that he cannot remember how long he had respiratory trouble but has been declining for quite some time. He recently had a fall last night and hurt his arm. He admits that he was short of breath at the time, but felt that the fall could be more from just losing his balance and walking in a dark room and tripping. He denies any lightheadedness or syncope. Admits to drinking a lot of beers on the couch and shortness of breath grabbed him. He complains of feeling dizzy for a short time. No chest pain. No nausea or vomiting. No fever, chills, or sweats. Admits that he has not been taking the fluid pills recently and came to the Emergency Room because of respiratory distress.   REVIEW OF SYSTEMS:  No blackout spells, syncope. No nausea or vomiting. No fever. No chills. No sweats. No weight loss. No weight gain. No hemoptysis or hematemesis. No bright red blood per rectum. No vision change or hearing change. Denies sputum production. Denies cough. He has had shortness breath. Recently, he had a fall. He had some sweating. He has had some noncompliance with his medications.   PAST MEDICAL HISTORY:  COPD, congestive heart failure.  PAST SURGICAL HISTORY:  Diskectomy.   SOCIAL HISTORY:  He smokes a pack a day for the last 26 years. Drinks about 6 beers a week he stated. No drug use.   FAMILY HISTORY:  Hypertension.   ALLERGIES:  None.   MEDICATIONS:  Advair Diskus 250/50 one puff twice a day, carvedilol 12.5 twice a day, Combivent 18 mcg 2 puffs q.6 hours  p.r.n., vitamin B12 at 1000 units 1 tablet daily, Lasix 40 mg a day, Klor-Con 10 mEq a day, lactulose 15 mL twice a day, Levaquin 750 once a day for the next 5 days, magnesium oxide 400 mg a day, Protonix 40 twice a day, prednisone 10 mg taper from 60 down to off once a day, Requip 10 mg once a day, sertraline 25 mg twice a day, ipratropium bromide inhaler 1 a day.   LABORATORIES:  Glucose 131, BUN 11, creatinine 0.5, sodium 120, potassium 5, chloride 77, CO2 of 38, calcium 8. Troponin less than 0.03. White count of 9.4, hemoglobin 13.5, hematocrit 40.2.  Chest x-ray:  Pulmonary vascular congestion. There is some atelectasis. Right wrist x-ray reviewed shows avulsion fracture of the styloid tip.   PHYSICAL EXAMINATION:  VITAL SIGNS:  Blood pressure 155/80, pulse of 90 and irregular, respiratory rate 16, afebrile.  HEENT: Normocephalic, atraumatic. Pupils equal and reactive to light.  NECK: Supple. No significant JVD, bruits or adenopathy. LUNGS: Clear to auscultation and percussion. No significant wheezing, rhonchi, or rales.  HEART: Irregular, irregular. Systolic murmur ejection murmur at left sternal border. PMI nondisplaced.  ABDOMEN: Benign.  EXTREMITIES: Within normal limits except for a cast on his right arm.  NEUROLOGIC: Intact, slightly confused, otherwise negative.   EKG shows atrial fibrillation, controlled rate, nonspecific finding.   ASSESSMENT:  Respiratory distress, hypercapnic, hypoxic, congestive heart failure, chronic obstructive pulmonary disease, hyponatremia, obesity,  fall, fracture of the right wrist.   PLAN:  1.  Agree with admit.  2.  Rule out for myocardial infarction.  3.  Continue BiPAP therapy and diuretics.  4.  Continue supplemental oxygen.  5.  Continue inhalers.  6.  Recommend pulmonary input.  7.  Also for cardiac issues, followup cardiac enzymes, EKGs. We will treat the patient medically and consider echocardiogram, control of atrial fibrillation. Consider  anticoagulation, but with his recent falls, he may not be a good candidate. We will consider antiarrhythmic as well.   8.  Continue with COPD management.  9.  For heart failure, continue diuretic. Consider ACE inhibitor.  10.  For hyponatremia, recommend fluid restriction and correction of sodium, which may have contributed to his falling.  11.  Alcoholism, concerned about his significant alcohol abuse, which may contribute to hyponatremia as well as his fall.  12.  DVT prophylaxis should be undertaken.  13.  We will treat his atrial fibrillation with rate control and hopefully rhythm control, but do not recommend anticoagulation at this stage.  14.  Continue to follow with you.   ____________________________ Bobbie Stack. Juliann Pares, MD ddc:ts D: 11/11/2013 19:00:00 ET T: 11/12/2013 03:58:24 ET JOB#: [id number]  cc: Andrew Blasius D. Juliann Pares, MD, <Dictator> Alwyn Pea MD ELECTRONICALLY SIGNED 12/13/2013 10:31

## 2014-06-02 NOTE — Discharge Summary (Signed)
PATIENT NAME:  [name], Jeremiah C MR#:  [id number] DATE OF BIRTH:  07-21-1960  DATE OF ADMISSION:  11/27/2013 DATE OF DISCHARGE:  12/05/2013  ADMITTING DIAGNOSIS: Acute respiratory failure.   DISCHARGE DIAGNOSES:  1. Acute respiratory failure requiring intubation felt to be due to multifactorial as a result of pneumonia and acute systolic congestive heart failure.  2. Acute hypercarbic and hypoxic respiratory failure.  3. Pneumonia possibly aspiration pneumonia.  4. Acute on chronic systolic congestive heart failure.  5. Acute on chronic COPD exacerbation.  6. Atrial fibrillation.  7. History of alcohol abuse with no evidence of delirium tremens.  8. Contraction metabolic alkalosis.  9. 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  10. Cirrhosis of her liver.  11. Osteoarthritis.  12. Hyperlipidemia.  13. History of neck surgery in the past.  14. Tobacco abuse.   CONSULTANTS DURING HOSPITALIZATION: Dr. Meredeth IdeFleming of pulmonary, Ned GraceNancy Phifer, MD.    PERTINENT LABS AND EVALUATIONS: Admitting glucose 150, BUN 24, creatinine 0.95, sodium 135, potassium 3.1, chloride 82, CO2 44, calcium was 7.6, magnesium 1.8. WBC 6.2, hemoglobin 12, and platelet count was 134,000. ABG: PH of 7.54, pCO2 of 60 and bicarbonate of 51.3. Blood cultures x 2: No growth. Sputum cultures no growth. Chest x-ray on admission showed stable chest with complete opacification of the right hemothorax. Most recent chest x-ray on 12/03/2013 showed improving aeration, with resolving bibasilar and right mid lungs subsegmental atelectasias. Moderate cardiomegaly   HOSPITAL COURSE: Please refer to H and P done by the admitting physician. The patient is a 54 year old white male who has had a few admissions to the hospital, presented with shortness of breath, respiratory failure. The patient was noted to have pCO2 of 100. He was noted to have acute hypercarbic hypoxic respiratory failure and was intubated. The  patient was thought to have acute pneumonia as well as acute systolic congestive heart failure as well as acute chronic obstructive pulmonary disease exacerbation. The patient was treated for all of these conditions and he was able to be extubated. The patient was kept on oxygen. His breathing is significantly improved. He has diuresed very well. . At this time, the patient is very anxious to go home. He did not want to go to rehab. He it being discharged to home with home health. Discharge instructions for congestive heart failure given.   DISCHARGE MEDICATIONS: Magnesium oxide 400 mg 1 tab p.o. daily, Combivent 2 puffs q. 6 p.r.n., folic acid 1 mg daily, amiodarone 400 mg 1 tab p.o. b.i.d., oxycodone 5 mg 1 to 2 tabs q. 4 to 6 as needed, carvedilol 12.5 mg 1 tab p.o. b.i.d., Lasix 40 mg 1 tab p.o. b.i.d., prednisone starting at 60 mg and taper by 10 until complete. KCl 20 mg daily, amoxicillin/clavulanic acid 1 tab p.o. b.i.d. x 5 days, Aldactone 25 mg 1 tab p.o. daily. Home oxygen: yes, 2 liters nasal cannula. The patient uses CPAP at bedtime.   DIET: Low-sodium, low-fat, low-cholesterol.   ACTIVITY: As tolerated. Follow-up primary MD in 1 to 2 weeks must wear CPAP at bedtime.   TIME SPENT: 40 minutes on this discharge.     ____________________________ Lacie ScottsShreyang H. Allena KatzPatel, MD shp:JT D: 12/07/2013 14:38:22 ET T: 12/07/2013 23:40:19 ET JOB#: [id number]  cc: Cervando Durnin H. Allena KatzPatel, MD, <Dictator> Charise CarwinSHREYANG H Emmilyn Crooke MD ELECTRONICALLY SIGNED 12/16/2013 8:38

## 2014-06-02 NOTE — Discharge Summary (Signed)
PATIENT NAME:  [name], [name] MR#:  [id number] DATE OF BIRTH:  Mar 02, 1960  DATE OF ADMISSION:  11/17/2013 DATE OF DISCHARGE:  11/21/2013  For a detailed note, please take a look at the history and physical done on admission by Dr. Enedina FinnerSona Patel.   DIAGNOSES AT DISCHARGE: Is as follows:  1. Distal right radial fracture, status post open reduction internal fixation.  2. Chronic atrial fibrillation.  3. Hypotension secondary to dehydration.  4. 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  5. History of tobacco abuse.  6. History of contraction alkalosis, now resolved.   CODE STATUS: The patient is being discharged on a low-sodium diet.   ACTIVITY: As tolerated.   FOLLOWUP: With his primary care physician, Dr. Zachery Dakinslarence Williams, in the next 1 to 2 weeks. Also follow up with Dr. Lady GaryFath in 1 to 2 weeks. The patient is being discharged with home health physical therapy services.   DISCHARGE MEDICATIONS: Magnesium oxide 400 mg daily, Combivent 2 puffs every 6 hours as needed, Advair 250/50 one puff b.i.d., Lasix 40 mg daily, Tylenol with hydrocodone 5/325 one tablets every 6 hours as needed, lisinopril 5 mg daily, folic acid 1 mg daily, Coreg 25 mg b.i.d., aspirin 81 mg daily, amiodarone 4 mg b.i.d.   CONSULTANTS: During the hospital course: Dr. Ernest PineHooten from orthopedics. Dr. Mariel KanskyKen Fath from cardiology.   PERTINENT STUDIES: Done during hospital course are as follows: None.   HOSPITAL COURSE: This is a 54 year old male with medical problems as mentioned above, presented to the hospital originally on 11/17/2013 due to hypotension. The patient apparently was scheduled to get his distal right radial fracture fixed, but preoperatively was noted to be severely hypotensive with blood pressures in the 70s.  1. Hypotension. This was likely related to his chronic Afib and also dehydration. The patient was aggressively hydrated with IV fluids and his Afib rate control was improved and his blood  pressure since then has significantly improved and have been stable. He did not have any clinical evidence of sepsis, any evidence of infection or any evidence of cardiogenic shock. His hypotension since being on fluids and having his rates better controlled have improved.  2. Chronic atrial fibrillation with rapid ventricular response. The patient's heart rate was somewhat labile in the hospital. The patient was given pulse doses of IV Cardizem. He was also started on an oral dose of amiodarone and his Coreg was increased. After having adjustments to his rate controlling medications, his heart rates have improved. His CHADS score is only 1; therefore, he will only require aspirin for anticoagulation, which he was discharged on. His echocardiogram showed an EF of 35%. The patient will follow up with cardiology as an outpatient.  3. 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 During this admission, his sodium was 126. After getting IV fluids, it had normalized and is currently stable.  4. History of alcohol dependence. The patient had no evidence of alcohol withdrawal while in the hospital.  5. Distal right radial fracture. This is secondary to recent fall he had. He was scheduled for elective surgery, which was canceled as he presented with severe hypotension. Once his sodium improved and his hypotension and Afib with RVR improved. he was taken to the OR and had open reduction internal fixation done on 10/13. Postoperatively, the patient has been doing well. He is being discharged on oral oxycodone for pain control and will follow up with orthopedics in the next 2  weeks. The patient is being discharged with home health, physical therapy services.  6. Restless leg syndrome. The patient was maintained on his Requip. He will resume that.  7. Nicotine dependence. The patient was maintained on nicotine patch.  8. History of chronic contraction alkalosis. This has now resolved  and patient was taken off his Diamox.   The patient is a full code.   He is being discharged home with home health services.   TIME SPENT: 40 minutes.    ____________________________ Rolly Pancake. Cherlynn Kaiser, MD vjs:TT D: 11/22/2013 15:29:41 ET T: 11/22/2013 15:54:11 ET JOB#: [id number]  cc: Rolly Pancake. Cherlynn Kaiser, MD, <Dictator> Houston Siren MD ELECTRONICALLY SIGNED 12/18/2013 10:54

## 2014-06-02 NOTE — Discharge Summary (Signed)
PATIENT NAME:  [name], [name] MR#:  [id number] DATE OF BIRTH:  03-Sep-1960  DATE OF ADMISSION:  03/06/2013 DATE OF DISCHARGE:  03/10/2013  DISCHARGE DIAGNOSES: 1.  Acute on chronic respiratory failure, likely from multifactorial etiology from chronic obstructive pulmonary disease exacerbation and/or congestive heart failure exacerbation and/or pneumonia with echo showing ejection fraction of 55%, now at baseline.  2.  Acute on chronic systolic heart failure, well compensated now. 3.  Chronic obstructive pulmonary disease exacerbation, now resolved.  4.  Chest pain, resolved, with negative cardiac enzymes, likely from work of breathing.  5.  Leukocytosis due to infection or stress, now resolved with antibiotics. 6.  Hyponatremia, improved with sodium of 129 on discharge, clinically doing much better. 7.  Hypercarbia, likely from chronic obstructive pulmonary disease. Recommended using BiPAP at home.   SECONDARY DIAGNOSES: 1.  Chronic hypoxic respiratory failure requiring 2.5 liters of oxygen continuous at home.  2.  Chronic obstructive pulmonary disease.  3.  Heart failure. 4.  Gastroesophageal reflux disease. 5.  Hepatitis C.  6.  Anemia.  7.  Hypertension.  8.  Restless leg syndrome. 9.  Duodenal ulcer.   CONSULTATIONS:  1.  Vascular surgery, Dr. Wyn Quaker.  2.  Nephrology, Dr. Thedore Mins.  3.  Physical therapy.   PROCEDURES AND RADIOLOGY: Central line placement in the right jugular vein by Dr. Wyn Quaker on the 26th of January 2015.   Chest x-ray on the 26th of January at 4:57 showed possible right lower lobe pneumonia.   Chest x-ray on the 26th of January at 1607 showed central line to cavoatrial junction without pneumothorax. Persistent right lower lobe infiltrate.   Chest x-ray on the 27th of January at 6:26 showed persistent atelectasis/infiltrate at the right lower lobe. Stable central line position.  Chest x-ray on the 27th of January at 10:25 showed same findings with right IJ central  line with the tip in the right atrium.   Chest x-ray on the 29th of January showed no new findings.   A 2-D echocardiogram on the 27th of January showed LVEF of 50% to 55%. Low-normal global LV systolic function. Borderline concentric LVH. Mildly increased LV septal thickness. Severely increased LV posterior wall thickness.   MAJOR LABORATORY PANEL: Blood cultures x 2 were negative on the 26th of January. HIV antibodies were nonreactive. Hepatitis panel was negative, except hepatitis B core antibody which was positive.   HISTORY AND SHORT HOSPITAL COURSE: The patient is a 54 year old male with above-mentioned medical problems who was admitted for acute on chronic hypoxic respiratory failure thought to be due to COPD and/or CHF exacerbation and/or pneumonia. He was also found to have severe hyponatremia. Please see Dr. Rob Hickman dictated history and physical for further details. The patient's sodium on admission was as low as 107. He was evaluated by nephrology, Dr. Mosetta Pigeon, and was started on 3% normal saline. His sodium was slowly improving, so was his breathing. He was evaluated by cardiology, Dr. Kirke Corin, who recommended 2-D echo, which was performed with results dictated above. The patient was close to his baseline on 30th of January, with his breathing much improved and sodium up to 129. His carbon dioxide was somewhat higher, which was thought to be due to underlying COPD, and was requested to use BiPAP, which seemed to help him here in the hospital also. Was discharged home on 30th of January. On the date of discharge, his vital signs were as follows: Temperature 98.4, heart rate 99 per minute, respiration 18 per minute, blood  pressure 132/90 mmHg. He was saturating 94% on 2 liters oxygen via nasal cannula, which is his baseline oxygen requirement.  PERTINENT PHYSICAL EXAMINATION: On the date of discharge: CARDIOVASCULAR: S1, S2 normal. No murmurs, rubs, or gallop.  LUNGS: Clear to auscultation  bilaterally. No wheezing, rales, rhonchi, or crepitation.  ABDOMEN: Soft, benign.  NEUROLOGIC: Nonfocal examination.   All other physical examination remained at baseline.   DISCHARGE MEDICATIONS: 1.  Cyanocobalamin 1000 mcg p.o. daily. 2.  Magnesium oxide 400 mg p.o. daily. 3.  Lactulose 15 mL p.o. b.i.d.  4.  Sertraline 25 mg p.o. daily.  5.  Protonix 40 mg p.o. b.i.d.  6.  Combivent 2 puffs inhaled every 6 hours as needed.  7.  Spiriva once daily.  8.  Advair 250/50, 1 puff b.i.d.  9.  Requip 1 mg p.o. daily.  10.  Coreg 12.5 mg p.o. b.i.d.  11.  Lasix 40 mg p.o. daily.  12.  Klor-Con 10 mEq p.o. daily.  13.  Prednisone 60 mg p.o. daily, taper 10 mg daily until finished. 14.  Levaquin 750 mg p.o. daily for 5 more days.   DISCHARGE DIET: Low-sodium, low-fat, low-cholesterol.   DISCHARGE ACTIVITY: As tolerated.   DISCHARGE INSTRUCTIONS AND FOLLOWUP: The patient was instructed to follow up with his primary care physician, Dr. Zachery Dakinslarence Williams, in 1 to 2 weeks. He will need followup with Dr. Lorine BearsMuhammad Arida in 2 to 4 weeks and Dr. Mosetta PigeonHarmeet Singh in 4 to 6 weeks. He was instructed to use 2 to 3 liters of oxygen via nasal cannula continuous at home. His home health services will be resumed. He was also instructed to use BiPAP at night and as needed during the daytime also.   TOTAL TIME DISCHARGING THIS PATIENT: 55 minutes.    ____________________________ Ellamae SiaVipul S. Sherryll BurgerShah, MD vss:jcm D: 03/11/2013 11:23:32 ET T: 03/11/2013 15:13:46 ET JOB#: [id number]  cc: Shardee Dieu S. Sherryll BurgerShah, MD, <Dictator> Dr. Johney Mainelarence Williams Muhammad A. Kirke CorinArida, MD Mosetta PigeonHarmeet Singh, MD Annice NeedyJason S. Dew, MD Ellamae SiaVIPUL S [facility] MD ELECTRONICALLY SIGNED 03/12/2013 3:41

## 2014-06-02 NOTE — Op Note (Signed)
PATIENT NAME:  [name], Davieon C MR#:  [id number] DATE OF BIRTH:  03-06-1960  DATE OF PROCEDURE:  03/06/2013  PREOPERATIVE DIAGNOSES: 1.  Hypoxia.  2.  Chronic obstructive pulmonary disease exacerbation.  3.  Liver disease, hepatitis C.  4.  Poor venous access.    POSTOPERATIVE DIAGNOSIS:  1.  Hypoxia.  2.  Chronic obstructive pulmonary disease exacerbation.  3.  Liver disease, hepatitis C.  4.  Poor venous access.    PROCEDURES: 1.  Ultrasound guidance for vascular access, right jugular vein.  2.  Placement of right jugular triple-lumen catheter.    SURGEON: Annice NeedyJason S Dew, M.D.   ANESTHESIA: Local.   ESTIMATED BLOOD LOSS: Minimal.   INDICATION FOR PROCEDURE: A 54 year old white male, admitted this morning for acute hypoxia and COPD exacerbation. He has poor venous access and we are asked to place a line.   DESCRIPTION OF PROCEDURE: The patient is laid flat in his critical care bed. Right neck was sterilely prepped and draped and a sterile surgical field was created. The right jugular vein was visualized on ultrasound and found to be patent. It was then accessed under direct ultrasound guidance without difficulty with a Seldinger needle. J-wire was then placed. After skin nick and dilatation, the triple lumen catheter was placed over the wire, and the wire was removed. All three lumens withdrew dark red nonpulsatile blood and flushed easily with sterile saline. It was secured at 19 cm to the skin with 3 silk sutures. A stat chest x-ray showed the catheter tip at the cavoatrial junction in excellent location.     ____________________________ Annice NeedyJason S. Dew, MD jsd:cg D: 03/06/2013 15:58:58 ET T: 03/07/2013 01:47:51 ET JOB#: [id number]  cc: Annice NeedyJason S. Dew, MD, <Dictator> Annice NeedyJASON S DEW MD ELECTRONICALLY SIGNED 03/14/2013 11:06

## 2014-06-02 NOTE — Consult Note (Signed)
Chief Complaint:  Subjective/Chief Complaint Patient states he feels reasonably well today denies any chest pain. improve shortness of breath no leg swelling.   VITAL SIGNS/ANCILLARY NOTES: **Vital Signs.:   05-Oct-15 11:35  Vital Signs Type Routine  Temperature Temperature (F) 97.4  Celsius 36.3  Temperature Source oral  Pulse Pulse 93  Respirations Respirations 20  Systolic BP Systolic BP 846  Diastolic BP (mmHg) Diastolic BP (mmHg) 79  Mean BP 90  Pulse Ox % Pulse Ox % 95  Pulse Ox Activity Level  At rest  Oxygen Delivery 3L  *Intake and Output.:   05-Oct-15 11:40  Grand Totals Intake:  150 Output:      Net:  150 24 Hr.:  150  IV (Secondary)      In:  150   Brief Assessment:  GEN well developed, well nourished, no acute distress   Cardiac Irregular  murmur present   Respiratory normal resp effort  clear BS  rhonchi   Gastrointestinal Normal   Gastrointestinal details normal Soft  Nontender  Nondistended  No masses palpable   EXTR negative cyanosis/clubbing, negative edema   Lab Results: Routine Chem:  05-Oct-15 05:02   Glucose, Serum 94  BUN  26  Creatinine (comp) 0.89  Sodium, Serum  126  Potassium, Serum 3.7  Chloride, Serum  80  CO2, Serum  43  Calcium (Total), Serum 8.5  Anion Gap  3  Osmolality (calc) 258  eGFR (African American) >60  eGFR (Non-African American) >60 (eGFR values <39m/min/1.73 m2 may be an indication of chronic kidney disease (CKD). Calculated eGFR, using the MRDR Study equation, is useful in  patients with stable renal function. The eGFR calculation will not be reliable in acutely ill patients when serum creatinine is changing rapidly. It is not useful in patients on dialysis. The eGFR calculation may not be applicable to patients at the low and high extremes of body sizes, pregnant women, and vetetarians.)  Result Comment CO2 - RESULTS VERIFIED BY REPEAT TESTING.  - CRITICAL VALUE PREVIOUSLY NOTIFIED.  - TPL  Result(s)  reported on 13 Nov 2013 at 05:35AM.    10:47   Osmolality, Blood  269 (Result(s) reported on 13 Nov 2013 at 12:02PM.)   Radiology Results: XRay:    29-Sep-15 22:31, Chest Portable Single View  Chest Portable Single View   REASON FOR EXAM:    SOB  COMMENTS:       PROCEDURE: DXR - DXR PORTABLE CHEST SINGLE VIEW  - Nov 07 2013 10:31PM     CLINICAL DATA:  Shortness of breath for a few days. Patient. Lasix  of few days ago. History of COPD, CHF, hypertension, liver disease.    EXAM:  PORTABLE CHEST - 1 VIEW    COMPARISON:  03/09/2013    FINDINGS:  Shallow inspiration. Cardiac enlargement with increased pulmonary  vascularity suggesting vascular congestion. No edema or  consolidation. Atelectasis in the lung bases. Suggestion of blunting  of costophrenic angles probably indicating small effusions. No  pneumothorax. Postoperative changes in the cervical spine.     IMPRESSION:  The cardiac enlargement with pulmonary vascular congestion. No  edema. Atelectasis in the lung bases. Small pleural effusions.      Electronically Signed    By: WLucienne CapersM.D.    On: 11/07/2013 22:52       Verified By: WNeale Burly M.D.,    29-Sep-15 22:31, Wrist Right Complete  Wrist Right Complete   REASON FOR EXAM:    pain s/p fall  COMMENTS:   Bedside (portable):Y    PROCEDURE: DXR - DXR WRIST RT COMP WITH OBLIQUES  - Nov 07 2013 10:31PM     CLINICAL DATA:  Right wrist pain after fall.    EXAM:  RIGHT WRIST - COMPLETE 3+ VIEW    COMPARISON:  None.    FINDINGS:  There is an acute comminuted impacted distal radius fracture. On the  lateral view, there is apex volar angulation of the fracture site.  Carpals remaining aligned with the distal radius. Probable avulsion  of the ulnar styloid tip,evidenced by 2 mm bony density separated  from the ulna styloid. There is marked soft tissue swelling about  the distal forearm.     IMPRESSION:  Acute, impacted and comminuted distal  radius fracture. Apex volar  angulation at the fracture site. Marked adjacent soft tissue  swelling.    Probable avulsion fracture of ulna styloid tip.      Electronically Signed    By: Curlene Dolphin M.D.    On: 11/07/2013 22:54         Verified By: Sheppard Evens, M.D.,    01-Oct-15 06:22, Chest Portable Single View  Chest Portable Single View   REASON FOR EXAM:    respiratory distress  COMMENTS:       PROCEDURE: DXR - DXR PORTABLE CHEST SINGLE VIEW  - Nov 09 2013  6:22AM     CLINICAL DATA:  Respiratory distress    EXAM:  PORTABLE CHEST - 1 VIEW    COMPARISON:  11/07/2013    FINDINGS:  Cardiac shadow remains enlarged. The degree of vascular congestion  is stable minimal left basilar atelectasis is noted. Some clearing  of the right lung base is noted. No acute bony abnormality is noted.  Cervical spine surgery is again noted.     IMPRESSION:  Slight clearing in the right lung base. The remainder of the exam is  stable from the previous study.      Electronically Signed    By: Inez Catalina M.D.    On: 11/09/2013 07:58         Verified By: Everlene Farrier, M.D.,    01-Oct-15 12:00, Chest Portable Single View  Chest Portable Single View   REASON FOR EXAM:    PICC line placement  COMMENTS:       PROCEDURE: DXR - DXR PORTABLE CHEST SINGLE VIEW  - Nov 09 2013 12:00PM     CLINICAL DATA:  PICC placement.    EXAM:  PORTABLE CHEST - 1 VIEW    COMPARISON:  Single view of the chest 11/09/2013.    FINDINGS:  A new left PICC is in place with the tip projecting deep in the  right atrium. Withdrawal of 7-7.5 cm is recommended. There is  cardiomegaly. There has been marked worsening of airspace disease in  the right lung base. Mild left basilaratelectasis is noted.     IMPRESSION:  Tip of left PICC projects deep in the right atrium. Recommend  withdrawal of 7.0 - 7.5 cm.    Increased right basilar airspace disease could be due to atelectasis  or  pneumonia.      Electronically Signed    EK:[id number]  Dalessio M.D.    On: 11/09/2013 12:38     Verified By: Ramond Dial, M.D.,    01-Oct-15 12:18, Chest Portable Single View  Chest Portable Single View   REASON FOR EXAM:    repositioning/withdrawal of picc line  COMMENTS:  PROCEDURE: DXR - DXR PORTABLE CHEST SINGLE VIEW  - Nov 09 2013 12:18PM     CLINICAL DATA:  Repositioning of recently placed central catheter    EXAM:  PORTABLE CHEST - 1 VIEW    COMPARISON:  Study obtained earlier in the day    FINDINGS:  The central catheter tip is in the region of the right atrium,  approximately 5 cm distal to the cavoatrial junction. No  pneumothorax. There is scarring in the right base region, stable.  There is patchy atelectasis in the left base, stable. No new  opacity. Heart is enlarged with pulmonary vascularity within normal  limits. No adenopathy. There is postoperative change in the cervical  spine region.     IMPRESSION:  Central catheter tip remains in right atrium. No pneumothorax.  Scarring right base. Stable atelectasis left base. Stable cardiac  enlargement.      Electronically Signed    By: Lowella Grip M.D.    On: 11/09/2013 12:40     Verified By: Leafy Kindle. Jasmine December, M.D.,    01-Oct-15 12:25, Chest Portable Single View  Chest Portable Single View   REASON FOR EXAM:    reassess PICC Line Placement  COMMENTS:       PROCEDURE: DXR - DXR PORTABLE CHEST SINGLE VIEW  - Nov 09 2013 12:25PM     CLINICAL DATA:  PICC line placement.    EXAM:  PORTABLE CHEST - 1 VIEW    COMPARISON:  11/09/2013 at 12:16 p.m.    FINDINGS:  The left PICC line has been retracted and its tip is now at the  cavoatrial junction.  Stably enlarged cardiopericardial silhouette. Stable obscuration the  right hemidiaphragm with mild hemidiaphragmatic elevation. Lower  cervical plate and screw fixator.     IMPRESSION:  1. The PICC line has been retracted and its tip is  now at the  cavoatrial junction. Otherwise stable.      Electronically Signed    By: Sherryl Barters M.D.    On: 11/09/2013 12:53         Verified By: Carron Curie, M.D.,  Cardiology:    29-Sep-15 22:05, ED ECG  Ventricular Rate 92  Atrial Rate 92  P-R Interval 180  QRS Duration 86  QT 352  QTc 435  P Axis 52  R Axis 10  T Axis 58  ECG interpretation   Normal sinus rhythm with sinus arrhythmia  Normal ECG  When compared with ECG of 06-Mar-2013 04:27,  No significant change was found  ----------unconfirmed----------  Confirmed by OVERREAD, NOT ([id number]), editor PEARSON, BARBARA ([id number]) on 11/08/2013 10:45:20 AM  ED ECG     30-Sep-15 11:50, ECG  Ventricular Rate 102  Atrial Rate 102  P-R Interval 160  QRS Duration 84  QT 348  QTc 453  P Axis 50  R Axis 13  T Axis 44  ECG interpretation   Sinus tachycardia  Possible Left atrial enlargement  Borderline ECG  When compared with ECG of 07-Nov-2013 22:05,  No significant change was found  Confirmed by PARASCHOS, ALEX ([id number]) on 11/08/2013 4:09:40 PM    Overreader: PARASCHOS, ALEX  ECG     03-Oct-15 10:01, Echo Doppler  Echo Doppler   REASON FOR EXAM:      COMMENTS:       PROCEDURE: [place] - ECHO DOPPLER COMPLETE(TRANSTHOR)  - Nov 11 2013 10:01AM     RESULT: Echocardiogram Report    Patient Name:   [name]  Date of Exam: 11/11/2013  Medical Rec #:  [id number]                Custom1:  Date of Birth:  [birth year]            Height:       62.0 in  Patient Age:    54 years              Weight:       213.0 lb  Patient Gender: M                     BSA:          1.96 m??    Indications: Atrial Fib  Sonographer:    LTM  Referring Phys: DIAMOND, MICHAEL, S    Summary:   1. Left ventricular ejection fraction, by visual estimation, is 35 to   40%.   2. Mildly to moderately decreased global left ventricular systolic   function.   3. Impaired relaxation pattern of LV diastolic filling.   4. Moderate concentric  left ventricular hypertrophy.   5. Moderately increased left ventricular septal thickness.   6. Mild mitral valve regurgitation.   7. Moderately increased left ventricular posterior wall thickness.   8. Mild tricuspid regurgitation.  2D AND M-MODE MEASUREMENTS (normal ranges within parentheses):  Left Ventricle:          Normal   AoV Cusp Separation: 1.30 cm (1.5-2.6)  IVSd (2D):      1.71 cm (0.7-1.1)  LVPWd (2D):     1.48 cm (0.7-1.1) Aorta/LA:                  Normal  LVIDd (2D):     4.18 cm (3.4-5.7) Aortic Root (2D): 3.10 cm (2.4-3.7)  LVIDs (2D):     3.75 cm           AoV Cusp Exc:     1.30 cm (1.5-2.6)  LV FS (2D):     10.3 %   (>25%)   Left Atrium (2D): 3.20 cm (1.9-4.0)  LV EF (2D):     22.7 %   (>50%)  LVIDd (Mmode):  5.25 cm (3.4-5.7)  LVIDs (Mmode):  4.25 cm           Right Ventricle:  LV FS (Mmode):  19.0 %   (>25%)   RVd (2D):  LV EF (Mmode):  39.0 %   (>09%)  LV DIASTOLIC FUNCTION:  MV Peak E: 0.92 m/s E/e' Ratio: 11.30                      Decel Time: 190 msec  SPECTRAL DOPPLER ANALYSIS (where applicable):  Mitral Valve:  MV P1/2 Time: 54.96 msec  MV Area, PHT: 4.00 cm??  Aortic Valve: AoV Max Vel: 0.95 m/s AoV Peak PG: 3.6 mmHg AoV Mean PG:  LVOT Vmax: 0.43 m/s LVOT VTI:  LVOT Diameter: 1.80 cm  AoV Area, Vmax: 1.15 cm?? AoV Area, VTI:  AoV Area, Vmn:  Tricuspid Valve and PA/RV Systolic Pressure: TR Max Velocity: 1.59 m/s RA   Pressure: 10 mmHg RVSP/PASP: 20.1 mmHg    PHYSICIAN INTERPRETATION:  Left Ventricle: The left ventricular internal cavity size was normal. LV   septal wall thickness was moderately increased. LV posterior wall   thickness was moderately increased. Moderate concentric left ventricular   hypertrophy. Global LV systolic function was mildly to moderately   decreased. Left ventricular ejection fraction, by visual estimation, is  35 to 40%. Spectral Doppler shows impaired relaxation pattern of LV     diastolic filling.  Right Ventricle: The  right ventricular size is normal. Global RV systolic   function is mildly reduced.  Left Atrium: The left atrium is normal in size.  Right Atrium: The right atrium is normal in size.  Pericardium: There is no evidence of pericardial effusion.  Mitral Valve: The mitral valve is normal in structure. Mild mitral valve   regurgitation is seen.  Tricuspid Valve: The tricuspid valve is normal. Mild tricuspid   regurgitation is visualized. The tricuspid regurgitant velocity is 1.59   m/s, and with an assumed right atrial pressure of 10 mmHg, the estimated   right ventricularsystolic pressure is normal at 20.1 mmHg.  Aortic Valve: The aortic valve is normal. No evidence of aortic valve   regurgitation is seen.  Pulmonic Valve: The pulmonic valve is normal.  [place] MD  Electronically signed by [place] MD  Signature Date/Time: 11/12/2013/10:38:59 AM    *** Final ***    IMPRESSION: .        Verified By: Yolonda Kida, M.D., MD   Assessment/Plan:  Assessment/Plan:  Assessment IMP CHF AFIB SOB HTN HypoNa ETOH abuse COPD CM Fx arm .   Plan PLAN  continue rate control for atrial fibrillation  I do not recommend long-term anticoagulation because of falls and alcohol abuse  shortness of breath recommend inhalers  Lasix therapy for heart failure  recommend ACE-inhibitor therapy for cardiomyopathy  low-dose beta-blockade therapy for cardiomyopathy  Correct hyponatremia  recommend treatment for alcohol abuse  consider physical therapy rehab  continue blood pressure control with beta blocker ACE-inhibitor  follow-up with Cardiology 1-2 weeks after discharge   Electronic Signatures: Lujean Amel D (MD)  (Signed 05-Oct-15 13:22)  Authored: Chief Complaint, VITAL SIGNS/ANCILLARY NOTES, Brief Assessment, Lab Results, Radiology Results, Assessment/Plan   Last Updated: 05-Oct-15 13:22 by Lujean Amel D (MD)

## 2014-06-02 NOTE — Op Note (Signed)
PATIENT NAME:  [name], Jeremiah C MR#:  [id number] DATE OF BIRTH:  07-05-1960  DATE OF PROCEDURE:  11/20/2013  PREOPERATIVE DIAGNOSIS:  Right distal radius fracture.   POSTOPERATIVE DIAGNOSIS:  Right distal radius fracture (comminuted).   PROCEDURE PERFORMED:  Open reduction and internal fixation of right distal radius fracture.   SURGEON:  Illene LabradorJames P. Angie FavaHooten Jr., MD   ANESTHESIA:  General.   ESTIMATED BLOOD LOSS:  25 mL.   FLUIDS REPLACED:  1000 mL of crystalloid.   TOURNIQUET TIME:  94 minutes.   DRAINS:  None.   IMPLANTS UTILIZED:  [employer] DVRA short volar plate, three 3.5 mm cortical screws, and seven 2.5 mm fully-threaded pegs.   INDICATIONS FOR SURGERY:  The patient is a 54 year old male who fell and sustained a comminuted right distal radius fracture. He had been admitted to the critical care unit due to exacerbation of respiratory issues. At the time of consult, an extensive amount of swelling was noted to the hand and wrist and it was elected to postpone surgical intervention. Upon presentation for outpatient surgery last Friday, he was noted to have a blood pressure (systolic) of 69. He was subsequently admitted and optimized for surgery as per internal medicine. It was felt that he was medically stable for surgery. Risks and benefits of surgical intervention were discussed with the patient. He expressed understanding of the risks, benefits, and agreed with plans for surgical intervention.   PROCEDURE IN DETAIL:  The patient was brought to the operating room, and after adequate general anesthesia was achieved, a tourniquet was placed on the patient's upper right arm. The patient's right hand and arm were cleaned and prepped with alcohol and DuraPrep, draped in the usual sterile fashion. A "timeout" was performed as per usual protocol. The right upper extremity was exsanguinated using an Esmarch, and the tourniquet was inflated to 250 mmHg. Loupe magnification was used  throughout the procedure. A longitudinal volar incision was made in line with the flexor carpi radialis tendon. Dissection was carried down to the tendon and tendon sheath was incised and the tendon was carefully retracted in an ulnar direction, taking care to protect the median nerve. Dissection was carried down to the pronator quadratus. An L-shaped incision was made, and the pronator was elevated off of the volar surface of the distal radius. Fracture site was identified, and soft tissue was removed from the fracture site. A provisional reduction was performed and confirmed using FluoroScan. A DVR-A short plate was placed on the volar surface and provisionally positioned using K wires. A 3.5 mm cortical screw was placed in the slotted position and the plate positioned so as to allow for appropriate positioning distally. K wires were placed through the distal portion of the plate to stabilize the distal radial fracture. Exceptional reduction was appreciated in both AP and lateral planes. A total of seven 2.5 mm fully-threaded pegs were inserted with good position noted in both AP and lateral planes. The proximal portion of the plate was further secured with two additional 3.5 mm cortical screws. Good position was again confirmed in AP, PA, and lateral views. The wound was irrigated with copious amounts of normal saline with antibiotic solution. The tourniquet was deflated after a total tourniquet time of 94 minutes. Good hemostasis was appreciated. The wound was reapproximated using #2-0 Vicryl. The skin was closed with a running subcuticular suture of #4-0 Vicryl. Steri-Strips were applied, and 10 mL of 0.25% Marcaine were injected along the incision site. A sterile dressing  was applied followed by application of volar splint.   The patient tolerated the procedure well. He was transported to the recovery room in stable condition.    ____________________________ Illene Labrador. Angie Fava., MD jph:nb D: 11/21/2013  00:43:27 ET T: 11/21/2013 03:02:07 ET JOB#: [id number]  cc: Illene Labrador. Angie Fava., MD, <Dictator> JAMES P Angie Fava MD ELECTRONICALLY SIGNED 11/22/2013 6:51

## 2014-06-02 NOTE — Consult Note (Signed)
CHIEF COMPLAINT and HISTORY:  Subjective/Chief Complaint hypoxia   History of Present Illness 54 yo WM with COPD exacerbation and hypoxia, has chronic liver disease, has many other ongoing issues and poor venous access.  We are asked to place a central line   PAST MEDICAL/SURGICAL HISTORY:  Past Medical History:   GERD - Esophageal Reflux:    Liver Disease:    Arthritis:    COPD:    Hypertension:    CHF:    etoh daily:    Hypertension:    Neck Surgery:   ALLERGIES:  Allergies:  No Known Allergies:   HOME MEDICATIONS:  Home Medications: Medication Instructions Status  levofloxacin 500 mg oral tablet 1 tab(s) orally once a day Active  Klor-Con 10 10 mEq oral tablet, extended release 1 tab(s) orally once a day Active  furosemide 40 mg oral tablet 1 tab(s) orally once a day Active  carvedilol 12.5 mg oral tablet 1 tab(s) orally 2 times a day Active  predniSONE 10 mg oral tablet 3 tabs day1, 2 tabs day2,3, 1 tab day4,5, 1/2 tab day 6,7 Active  Requip 1 mg oral tablet  Active  Advair Diskus 250 mcg-50 mcg inhalation powder 1 puff(s) inhaled 2 times a day Active  tiotropium 18 mcg inhalation capsule 1 cap(s) inhaled once a day Active  magnesium oxide 400 mg (241.3 mg elemental magnesium) oral tablet 1 tab(s) orally once a day Active  cyanocobalamin 1000 mcg oral tablet 1 tab(s) orally once a day Active  lactulose 10 g/15 mL oral syrup 15 milliliter(s) orally 2 times a day Active  sertraline 25 mg oral tablet 1 tab(s) orally once a day Active  pantoprazole 40 mg oral delayed release tablet 1 tab(s) orally 2 times a day Active  Combivent 18 mcg-103 mcg-/inh inhalation aerosol 2 puff(s) inhaled every 6 hours, As Needed- for Shortness of Breath  Active   Family and Social History:  Family History Coronary Artery Disease  Hypertension  Smoking   Social History positive  tobacco (Current within 1 year), positive ETOH   Place of Living Home   Review of Systems:   Fever/Chills No   Cough Yes   Sputum Yes   Abdominal Pain No   Diarrhea No   Constipation No   Nausea/Vomiting No   SOB/DOE Yes   Chest Pain No   Telemetry Reviewed NSR   Dysuria No   Tolerating PT Yes   Tolerating Diet Yes   Medications/Allergies Reviewed Medications/Allergies reviewed   Physical Exam:  GEN well developed, well nourished, looks older than stated age   HEENT pink conjunctivae, moist oral mucosa   NECK No masses  trachea midline   RESP postive use of accessory muscles  crackles   CARD regular rate  no JVD   VASCULAR ACCESS none   ABD denies tenderness  soft   GU foley catheter in place  clear yellow urine draining   LYMPH negative neck, negative axillae   EXTR negative cyanosis/clubbing, negative edema   SKIN normal to palpation   NEURO cranial nerves intact, motor/sensory function intact   PSYCH alert, A+O to time, place, person   LABS:  Laboratory Results: Hepatic:    26-Jan-15 04:33, Comprehensive Metabolic Panel  Bilirubin, Total 1.7  Alkaline Phosphatase 145  45-117  NOTE: New Reference Range  12/30/12  SGPT (ALT) 18  SGOT (AST) 31  Total Protein, Serum 8.0  Albumin, Serum 3.3    26-Jan-15 04:36, Bilirubin, Direct  Bilirubin, Direct 0.60  Result(s) reported on  06 Mar 2013 at 09:34AM.  Routine Micro:    26-Jan-15 04:32, Blood Culture  Micro Text Report   BLOOD CULTURE    COMMENT                   NO GROWTH IN 8-12 HOURS     ANTIBIOTIC  Specimen Source   left hand  Culture Comment   NO GROWTH IN 8-12 HOURS   Result(s) reported on 06 Mar 2013 at 02:00PM.    26-Jan-15 04:33, Blood Culture  Micro Text Report   BLOOD CULTURE    COMMENT                   NO GROWTH IN 8-12 HOURS     ANTIBIOTIC  Specimen Source   left forearm  Culture Comment   NO GROWTH IN 8-12 HOURS   Result(s) reported on 06 Mar 2013 at 02:00PM.  Lab:    26-Jan-15 04:35, ABG  pH (ABG) 7.21  PCO2 99  PO2 101  FiO2 60  Base Excess 7.8   HCO3 39.6  O2 Saturation 98.1  O2 Device BIPAP  Specimen Site (ABG)   RT RADIAL  Specimen Type (ABG) ARTERIAL  Patient Temp (ABG) 37.0    26-Jan-15 06:00, ABG  pH (ABG) 7.28  PCO2 85  PO2 120  FiO2 60  Base Excess 9.6  HCO3 39.9  O2 Saturation 99.2  O2 Device BIPAP  Specimen Site (ABG)   RT RADIAL  Specimen Type (ABG) ARTERIAL  Patient Temp (ABG) 37.0  Routine Chem:    26-Jan-15 04:33, B-Type Natriuretic Peptide ([facility])  B-Type Natriuretic Peptide [facility]) 3819  Result(s) reported on 06 Mar 2013 at 04:56AM.    26-Jan-15 04:33, Comprehensive Metabolic Panel  Glucose, Serum 177  BUN 7  Creatinine (comp) 0.49  Sodium, Serum 107  Potassium, Serum 4.7  Chloride, Serum 70  CO2, Serum 36  Calcium (Total), Serum 8.5  Osmolality (calc) 220  eGFR (African American) >60  eGFR (Non-African American) >60  eGFR values <78m/min/1.73 m2 may be an indication of chronic  kidney disease (CKD).  Calculated eGFR is useful in patients with stable renal function.  The eGFR calculation will not be reliable in acutely ill patients  when serum creatinine is changing rapidly. It is not useful in   patients on dialysis. The eGFR calculation may not be applicable  to patients at the low and high extremes of body sizes, pregnant  women, and vegetarians.  Result Comment   SODIUM - NOTIFIED OF CRITICAL VALUE   - RESULTS VERIFIED BY REPEAT TESTING.   - CALLED TO APRIL BURMGARD AT [id number]   - 03/06/2013..Marland KitchenMarland KitchenPL   - READ-BACK PROCESS PERFORMED.   Result(s) reported on 06 Mar 2013 at 04:56AM.  Anion Gap 1    26-Jan-15 04:33, Hemoglobin A1c ([facility])  Hemoglobin A1c ([facility] 5.3  The American Diabetes Association recommends that a primary goal of  therapy should be <7% and that physicians should reevaluate the  treatment regimen in patients with HbA1c values consistently >8%.    26-Jan-15 04:35, ABG  Result Comment   - NOTIFIED OF CRITICAL VALUE   - READ-BACK PROCESS PERFORMED.   - dr wDahlia Clientat [phone] 03/06/13 emk   - bipap 15/8 r 10 60%   Result(s) reported on 06 Mar 2013 at 04:48AM.    26-Jan-15 06:00, ABG  Result Comment   - NOTIFIED OF CRITICAL VALUE   - READ-BACK PROCESS PERFORMED.   - dr wDahlia Clientat [phone]  03/06/13 emk   - bipap 15/8 r 10 60%   - lactic 1.4   Result(s) reported on 06 Mar 2013 at 06:14AM.    26-Jan-15 25:05, Basic Metabolic Panel (w/Total Calcium)  Glucose, Serum 126  BUN 8  Creatinine (comp) 0.59  Sodium, Serum 109  Potassium, Serum 4.3  Chloride, Serum 69  CO2, Serum 37  Calcium (Total), Serum 8.7  Anion Gap 3  Osmolality (calc) 222  eGFR (African American) >60  eGFR (Non-African American) >60  eGFR values <73m/min/1.73 m2 may be an indication of chronic  kidney disease (CKD).  Calculated eGFR is useful in patients with stable renal function.  The eGFR calculation will not be reliable in acutely ill patients  when serum creatinine is changing rapidly. It is not useful in   patients on dialysis. The eGFR calculation may not be applicable  to patients at the low and high extremes of body sizes, pregnant  women, and vegetarians.  Result Comment   SODIUM - RESULTS VERIFIED BY REPEAT TESTING.   - NOTIFIED OF CRITICAL VALUE   - C/PAM CRAWFORD AT 1104 03/06/13-DAS   - READ-BACK PROCESS PERFORMED.   Result(s) reported on 06 Mar 2013 at 10:56AM.  Cardiac:    26-Jan-15 04:33, Cardiac Panel  CK, Total 42  CPK-MB, Serum 4.0  Result(s) reported on 06 Mar 2013 at 08:46AM.    26-Jan-15 04:33, Troponin I  Troponin I < 0.02  0.00-0.05  0.05 ng/mL or less: NEGATIVE   Repeat testing in 3-6 hrs   if clinically indicated.  >0.05 ng/mL: POTENTIAL   MYOCARDIAL INJURY. Repeat   testing in 3-6 hrs if   clinically indicated.  NOTE: An increase or decrease   of 30% or more on serial   testing suggests a   clinically important change    26-Jan-15 09:03, Cardiac Panel  CK, Total 37  CPK-MB, Serum 3.4  Result(s) reported on 06 Mar 2013 at 09:41AM.    26-Jan-15  09:03, Troponin I  Troponin I < 0.02  0.00-0.05  0.05 ng/mL or less: NEGATIVE   Repeat testing in 3-6 hrs   if clinically indicated.  >0.05 ng/mL: POTENTIAL   MYOCARDIAL INJURY. Repeat   testing in 3-6 hrs if   clinically indicated.  NOTE: An increase or decrease   of 30% or more on serial   testing suggests a   clinically important change    26-Jan-15 12:30, Cardiac Panel  CK, Total 40  CPK-MB, Serum 3.3  Result(s) reported on 06 Mar 2013 at 12:58PM.  Routine Hem:    26-Jan-15 04:33, Hemogram, Platelet Count  WBC (CBC) 21.8  RBC (CBC) 5.14  Hemoglobin (CBC) 16.6  Hematocrit (CBC) 49.0  Platelet Count (CBC) 199  Result(s) reported on 06 Mar 2013 at 04:45AM.  MCV 95  MCH 32.3  MCHC 33.8  RDW 13.8   RADIOLOGY:  Radiology Results: XRay:    04-Feb-14 15:14, Chest Portable Single View  Chest Portable Single View  REASON FOR EXAM:    Shortness of Breath  COMMENTS:       PROCEDURE: DXR - DXR PORTABLE CHEST SINGLE VIEW  - Mar 15 2012  3:14PM     RESULT: Comparison is made to study of December 24, 2011.    The cardiac silhouette remains enlarged. The interstitialmarkings are   more prominent today. The hemidiaphragms are less well demonstrated.    IMPRESSION:  The findings are consistent with congestive heart failure   with mild interstitial edema. When the patient  can tolerate the   procedure, a PA and lateral chest x-ray would be ofvalue.     Dictation Site: 2    Verified By: DAVID A. Martinique, M.D., MD    26-Jan-15 04:57, Chest Portable Single View  Chest Portable Single View  REASON FOR EXAM:    shob  COMMENTS:       PROCEDURE: DXR - DXR PORTABLE CHEST SINGLE VIEW  - Mar 06 2013  4:57AM     CLINICAL DATA:  Chest pain, shortness of breath    EXAM:  PORTABLE CHEST - 1 VIEW    COMPARISON:  03/15/2012    FINDINGS:  Prominent cardiomediastinal contours. Right pleural effusion and  associated airspace consolidation. Trace left effusion suspected.  Interstitial  coarsening. No pneumothorax. Cervical fusion hardware.  No definite acute osseous finding.     IMPRESSION:  Prominent cardiomediastinal contours.    Interstitial prominence is at least in part chronic/ COPD.  Superimposed interstitial edema or atypical/viral infection not  excluded.    Right lower lobe opacity may reflect atelectasis/scarring or  pneumonia. Small right and trace left pleural effusion suspected.      Electronically Signed    By: Carlos Levering M.D.    On: 03/06/2013 05:03         Verified By: Tommi Rumps, M.D.,  LabUnknown:    04-Feb-14 15:14, Chest Portable Single View  PACS Image    26-Jan-15 04:57, Chest Portable Single View  PACS Image   ASSESSMENT AND PLAN:  Assessment/Admission Diagnosis hypoxia liver disease COPD exacerbation poor access   Plan central line placed at bedside without difficulty   level 3   Electronic Signatures: Algernon Huxley (MD)  (Signed 26-Jan-15 16:02)  Authored: Chief Complaint and History, PAST MEDICAL/SURGICAL HISTORY, ALLERGIES, HOME MEDICATIONS, Family and Social History, Review of Systems, Physical Exam, LABS, RADIOLOGY, Assessment and Plan   Last Updated: 26-Jan-15 16:02 by Algernon Huxley (MD)

## 2014-06-02 NOTE — Consult Note (Signed)
Chief Complaint:  Subjective/Chief Complaint The patient states he feels fine denies any chest pain shortness of breath or palpitations   VITAL SIGNS/ANCILLARY NOTES: **Vital Signs.:   04-Oct-15 12:28  Vital Signs Type Routine  Temperature Temperature (F) 97.8  Celsius 36.5  Temperature Source oral  Pulse Pulse 69  Respirations Respirations 20  Systolic BP Systolic BP 683  Diastolic BP (mmHg) Diastolic BP (mmHg) 82  Mean BP 96  Pulse Ox % Pulse Ox % 95  Pulse Ox Activity Level  At rest  Oxygen Delivery 3L  *Intake and Output.:   04-Oct-15 12:27  Grand Totals Intake:  240 Output:      Net:  240 24 Hr.:  479  Oral Intake      In:  240  Percentage of Meal Eaten  100   Brief Assessment:  GEN well developed, well nourished, no acute distress   Cardiac Irregular  murmur present   Respiratory normal resp effort  clear BS  rhonchi   Gastrointestinal Normal   Gastrointestinal details normal Soft  Nontender  Nondistended  No masses palpable   EXTR negative cyanosis/clubbing, negative edema   Lab Results: Routine Chem:  04-Oct-15 04:44   Glucose, Serum  135  BUN  22  Creatinine (comp) 0.84  Sodium, Serum  126  Potassium, Serum 3.7  Chloride, Serum  79  CO2, Serum  44  Calcium (Total), Serum  8.1  Anion Gap  3  Osmolality (calc) 259  eGFR (African American) >60  eGFR (Non-African American) >60 (eGFR values <64m/min/1.73 m2 may be an indication of chronic kidney disease (CKD). Calculated eGFR, using the MRDR Study equation, is useful in  patients with stable renal function. The eGFR calculation will not be reliable in acutely ill patients when serum creatinine is changing rapidly. It is not useful in patients on dialysis. The eGFR calculation may not be applicable to patients at the low and high extremes of body sizes, pregnant women, and vetetarians.)  Result Comment LABS - This specimen was collected through an   - indwelling catheter or arterial line.  - A  minimum of 520m of blood was wasted prior    - to collecting the sample.  Interpret  - results with caution. CO2 - RESULTS VERIFIED BY REPEAT TESTING. - C/CHARLOTTE [id number]/RWW  - NOTIFIED OF CRITICAL VALUE  - READ-BACK PROCESS PERFORMED.  Result(s) reported on 12 Nov 2013 at 05:20AM.   Radiology Results: XRay:    29-Sep-15 22:31, Chest Portable Single View  Chest Portable Single View   REASON FOR EXAM:    SOB  COMMENTS:       PROCEDURE: DXR - DXR PORTABLE CHEST SINGLE VIEW  - Nov 07 2013 10:31PM     CLINICAL DATA:  Shortness of breath for a few days. Patient. Lasix  of few days ago. History of COPD, CHF, hypertension, liver disease.    EXAM:  PORTABLE CHEST - 1 VIEW    COMPARISON:  03/09/2013    FINDINGS:  Shallow inspiration. Cardiac enlargement with increased pulmonary  vascularity suggesting vascular congestion. No edema or  consolidation. Atelectasis in the lung bases. Suggestion of blunting  of costophrenic angles probably indicating small effusions. No  pneumothorax. Postoperative changes in the cervical spine.     IMPRESSION:  The cardiac enlargement with pulmonary vascular congestion. No  edema. Atelectasis in the lung bases. Small pleural effusions.      Electronically Signed    By: WiLucienne Capers.D.    On: 11/07/2013 22:52  Verified By: Neale Burly, M.D.,    29-Sep-15 22:31, Wrist Right Complete  Wrist Right Complete   REASON FOR EXAM:    pain s/p fall  COMMENTS:   Bedside (portable):Y    PROCEDURE: DXR - DXR WRIST RT COMP WITH OBLIQUES  - Nov 07 2013 10:31PM     CLINICAL DATA:  Right wrist pain after fall.    EXAM:  RIGHT WRIST - COMPLETE 3+ VIEW    COMPARISON:  None.    FINDINGS:  There is an acute comminuted impacted distal radius fracture. On the  lateral view, there is apex volar angulation of the fracture site.  Carpals remaining aligned with the distal radius. Probable avulsion  of the ulnar styloid  tip,evidenced by 2 mm bony density separated  from the ulna styloid. There is marked soft tissue swelling about  the distal forearm.     IMPRESSION:  Acute, impacted and comminuted distal radius fracture. Apex volar  angulation at the fracture site. Marked adjacent soft tissue  swelling.    Probable avulsion fracture of ulna styloid tip.      Electronically Signed    By: Curlene Dolphin M.D.    On: 11/07/2013 22:54         Verified By: Sheppard Evens, M.D.,    01-Oct-15 06:22, Chest Portable Single View  Chest Portable Single View   REASON FOR EXAM:    respiratory distress  COMMENTS:       PROCEDURE: DXR - DXR PORTABLE CHEST SINGLE VIEW  - Nov 09 2013  6:22AM     CLINICAL DATA:  Respiratory distress    EXAM:  PORTABLE CHEST - 1 VIEW    COMPARISON:  11/07/2013    FINDINGS:  Cardiac shadow remains enlarged. The degree of vascular congestion  is stable minimal left basilar atelectasis is noted. Some clearing  of the right lung base is noted. No acute bony abnormality is noted.  Cervical spine surgery is again noted.     IMPRESSION:  Slight clearing in the right lung base. The remainder of the exam is  stable from the previous study.      Electronically Signed    By: Inez Catalina M.D.    On: 11/09/2013 07:58         Verified By: Everlene Farrier, M.D.,    01-Oct-15 12:00, Chest Portable Single View  Chest Portable Single View   REASON FOR EXAM:    PICC line placement  COMMENTS:       PROCEDURE: DXR - DXR PORTABLE CHEST SINGLE VIEW  - Nov 09 2013 12:00PM     CLINICAL DATA:  PICC placement.    EXAM:  PORTABLE CHEST - 1 VIEW    COMPARISON:  Single view of the chest 11/09/2013.    FINDINGS:  A new left PICC is in place with the tip projecting deep in the  right atrium. Withdrawal of 7-7.5 cm is recommended. There is  cardiomegaly. There has been marked worsening of airspace disease in  the right lung base. Mild left basilaratelectasis is noted.      IMPRESSION:  Tip of left PICC projects deep in the right atrium. Recommend  withdrawal of 7.0 - 7.5 cm.    Increased right basilar airspace disease could be due to atelectasis  or pneumonia.      Electronically Signed    [id number]  Dalessio M.D.    On: 11/09/2013 12:38     Verified By: Ramond Dial,  M.D.,    01-Oct-15 12:18, Chest Portable Single View  Chest Portable Single View   REASON FOR EXAM:    repositioning/withdrawal of picc line  COMMENTS:       PROCEDURE: DXR - DXR PORTABLE CHEST SINGLE VIEW  - Nov 09 2013 12:18PM     CLINICAL DATA:  Repositioning of recently placed central catheter    EXAM:  PORTABLE CHEST - 1 VIEW    COMPARISON:  Study obtained earlier in the day    FINDINGS:  The central catheter tip is in the region of the right atrium,  approximately 5 cm distal to the cavoatrial junction. No  pneumothorax. There is scarring in the right base region, stable.  There is patchy atelectasis in the left base, stable. No new  opacity. Heart is enlarged with pulmonary vascularity within normal  limits. No adenopathy. There is postoperative change in the cervical  spine region.     IMPRESSION:  Central catheter tip remains in right atrium. No pneumothorax.  Scarring right base. Stable atelectasis left base. Stable cardiac  enlargement.      Electronically Signed    By: Lowella Grip M.D.    On: 11/09/2013 12:40     Verified By: Leafy Kindle. Jasmine December, M.D.,    01-Oct-15 12:25, Chest Portable Single View  Chest Portable Single View   REASON FOR EXAM:    reassess PICC Line Placement  COMMENTS:       PROCEDURE: DXR - DXR PORTABLE CHEST SINGLE VIEW  - Nov 09 2013 12:25PM     CLINICAL DATA:  PICC line placement.    EXAM:  PORTABLE CHEST - 1 VIEW    COMPARISON:  11/09/2013 at 12:16 p.m.    FINDINGS:  The left PICC line has been retracted and its tip is now at the  cavoatrial junction.  Stably enlarged cardiopericardial silhouette. Stable  obscuration the  right hemidiaphragm with mild hemidiaphragmatic elevation. Lower  cervical plate and screw fixator.     IMPRESSION:  1. The PICC line has been retracted and its tip is now at the  cavoatrial junction. Otherwise stable.      Electronically Signed    By: Sherryl Barters M.D.    On: 11/09/2013 12:53         Verified By: Carron Curie, M.D.,  Cardiology:    29-Sep-15 22:05, ED ECG  Ventricular Rate 92  Atrial Rate 92  P-R Interval 180  QRS Duration 86  QT 352  QTc 435  P Axis 52  R Axis 10  T Axis 58  ECG interpretation   Normal sinus rhythm with sinus arrhythmia  Normal ECG  When compared with ECG of 06-Mar-2013 04:27,  No significant change was found  ----------unconfirmed----------  Confirmed by OVERREAD, NOT ([id number]), editor PEARSON, BARBARA ([id number]) on 11/08/2013 10:45:20 AM  ED ECG     30-Sep-15 11:50, ECG  Ventricular Rate 102  Atrial Rate 102  P-R Interval 160  QRS Duration 84  QT 348  QTc 453  P Axis 50  R Axis 13  T Axis 44  ECG interpretation   Sinus tachycardia  Possible Left atrial enlargement  Borderline ECG  When compared with ECG of 07-Nov-2013 22:05,  No significant change was found  Confirmed by PARASCHOS, ALEX ([id number]) on 11/08/2013 4:09:40 PM    Overreader: PARASCHOS, ALEX  ECG     03-Oct-15 10:01, Echo Doppler  Echo Doppler   REASON FOR EXAM:      COMMENTS:  PROCEDURE: [facility] - ECHO DOPPLER COMPLETE(TRANSTHOR)  - Nov 11 2013 10:01AM     RESULT: Echocardiogram Report    Patient Name:   [name] Date of Exam: 11/11/2013  Medical Rec #:  [id number]                Custom1:  Date of Birth:  [birth year]            Height:       62.0 in  Patient Age:    89 years              Weight:       213.0 lb  Patient Gender: M                     BSA:          1.96 m??    Indications: Atrial Fib  Sonographer:    LTM  Referring Phys: DIAMOND, MICHAEL, S    Summary:   1. Left ventricular ejection fraction, by visual  estimation, is 35 to   40%.   2. Mildly to moderately decreased global left ventricular systolic   function.   3. Impaired relaxation pattern of LV diastolic filling.   4. Moderate concentric left ventricular hypertrophy.   5. Moderately increased left ventricular septal thickness.   6. Mild mitral valve regurgitation.   7. Moderately increased left ventricular posterior wall thickness.   8. Mild tricuspid regurgitation.  2D AND M-MODE MEASUREMENTS (normal ranges within parentheses):  Left Ventricle:          Normal   AoV Cusp Separation: 1.30 cm (1.5-2.6)  IVSd (2D):      1.71 cm (0.7-1.1)  LVPWd (2D):     1.48 cm (0.7-1.1) Aorta/LA:                  Normal  LVIDd (2D):     4.18 cm (3.4-5.7) Aortic Root (2D): 3.10 cm (2.4-3.7)  LVIDs (2D):     3.75 cm           AoV Cusp Exc:     1.30 cm (1.5-2.6)  LV FS (2D):     10.3 %   (>25%)   Left Atrium (2D): 3.20 cm (1.9-4.0)  LV EF (2D):     22.7 %   (>50%)  LVIDd (Mmode):  5.25 cm (3.4-5.7)  LVIDs (Mmode):  4.25 cm           Right Ventricle:  LV FS (Mmode):  19.0 %   (>25%)   RVd (2D):  LV EF (Mmode):  39.0 %   (>59%)  LV DIASTOLIC FUNCTION:  MV Peak E: 0.92 m/s E/e' Ratio: 11.30                      Decel Time: 190 msec  SPECTRAL DOPPLER ANALYSIS (where applicable):  Mitral Valve:  MV P1/2 Time: 54.96 msec  MV Area, PHT: 4.00 cm??  Aortic Valve: AoV Max Vel: 0.95 m/s AoV Peak PG: 3.6 mmHg AoV Mean PG:  LVOT Vmax: 0.43 m/s LVOT VTI:  LVOT Diameter: 1.80 cm  AoV Area, Vmax: 1.15 cm?? AoV Area, VTI:  AoV Area, Vmn:  Tricuspid Valve and PA/RV Systolic Pressure: TR Max Velocity: 1.59 m/s RA   Pressure: 10 mmHg RVSP/PASP: 20.1 mmHg    PHYSICIAN INTERPRETATION:  Left Ventricle: The left ventricular internal cavity size was normal. LV   septal wall thickness was moderately increased. LV posterior wall  thickness was moderately increased. Moderate concentric left ventricular   hypertrophy. Global LV systolic function was mildly to moderately    decreased. Left ventricular ejection fraction, by visual estimation, is   35 to 40%. Spectral Doppler shows impaired relaxation pattern of LV     diastolic filling.  Right Ventricle: The right ventricular size is normal. Global RV systolic   function is mildly reduced.  Left Atrium: The left atrium is normal in size.  Right Atrium: The right atrium is normal in size.  Pericardium: There is no evidence of pericardial effusion.  Mitral Valve: The mitral valve is normal in structure. Mild mitral valve   regurgitation is seen.  Tricuspid Valve: The tricuspid valve is normal. Mild tricuspid   regurgitation is visualized. The tricuspid regurgitant velocity is 1.59   m/s, and with an assumed right atrial pressure of 10 mmHg, the estimated   right ventricularsystolic pressure is normal at 20.1 mmHg.  Aortic Valve: The aortic valve is normal. No evidence of aortic valve   regurgitation is seen.  Pulmonic Valve: The pulmonic valve is normal.  [place] MD  Electronically signed by [place] MD  Signature Date/Time: 11/12/2013/10:38:59 AM    *** Final ***    IMPRESSION: .        Verified By: Yolonda Kida, M.D., MD   Assessment/Plan:  Assessment/Plan:  Assessment IMP  atrial fibrillation  tachycardia  ETOH withdrawal symptoms  obesity  shortness of breath  fractured arm  falls  EtOH abuse  hyponatremia .   Plan PLAN  continue rate control for AFib  consider antiarrhythmic for AFib  blood pressure control  weight loss exercise  correct hyponatremia  orthopedic treatment to improve fractured arm  refrain from alcohol abuse  weight loss exercise portion control  consider treatment for alcohol abuse   Electronic Signatures: Lujean Amel D (MD)  (Signed 05-Oct-15 13:15)  Authored: Chief Complaint, VITAL SIGNS/ANCILLARY NOTES, Brief Assessment, Lab Results, Radiology Results, Assessment/Plan   Last Updated: 05-Oct-15 13:15 by Yolonda Kida (MD)

## 2014-06-02 NOTE — H&P (Signed)
PATIENT NAME:  [name], [name] MR#:  [id number] DATE OF BIRTH:  [birth year]  DATE OF ADMISSION:  11/17/2013  PRIMARY CARE PHYSICIAN:  Not known.   NEPHROLOGIST:  Lamont DowdySarath Kolluru, MD  CARDIOLOGIST: Dorothyann Pengwayne Callwood, MD     REASON FOR CONSULTATION: Hypotension.   HISTORY OF PRESENT ILLNESS:  [name] is a 54 year old obese Caucasian gentleman well known to our service who was recently discharged 10/05 after he was admitted at that time with acute respiratory failure which was suspected due to mild CHF, COPD  exacerbation and he sustained a right distal radial fracture. The patient was discharged, seen by Dr. Ernest PineHooten as outpatient and today in preop here to get his surgery, however it was noted his systolic blood pressure was 64 during the preop day.  Internal medicine was consulted for hypotension.  The patient did not take any of his hypertensive medications this morning and did not take it last night either.  He denies any chest pain, shortness of breath, dizziness or diaphoresis. He is being admitted for further evaluation and management. He received a bolus of 250 mg IV fluids. His current blood pressure is in the 80s during my evaluation.  PAST MEDICAL HISTORY:  1. History of cardiomyopathy, which appears alcohol-induced with ejection fraction of around 35%.  2. Cirrhosis of liver.  3. Arthritis.  4. Chronic obstructive pulmonary disease with chronic home oxygen.  5. Hypertension.  6. Chronic alcohol abuse.  7. Hypertension.  8. History of neck surgery in the past.  9. Tobacco abuse.   ALLERGIES: No known drug allergies.    HOME MEDICATIONS:   1.  Lasix 40 mg p.o. daily.  2.  Folic acid 1 mg p.o. daily.  3.  Combivent 2 puffs every 6 hours as needed. ' 4.  Carvedilol 12.5 mg b.i.d.  5.  Advair 250/50 one puff b.i.d.  6.  Acetazolamide 250 mg p.o. b.i.d.  7.  Acetaminophen oxycodone 5/325 one every 6 hours.   REVIEW OF SYSTEMS:.  CONSTITUTIONAL: No fever, fatigue,  weakness.  EYES: No blurred or double vision.  ENT: No tinnitus, ear pain, hearing loss or postnasal drip.  RESPIRATORY: No cough, wheeze, hemoptysis, or dyspnea. Positive for  chronic obstructive pulmonary disease.  CARDIOVASCULAR: No chest pain pulmonary edema. Positive for dyspnea on exertion.  GASTROINTESTINAL: No nausea, vomiting, diarrhea, abdominal pain, or rectal bleeding.  GENITOURINARY: No dysuria, hematuria, or frequency.  ENDOCRINE: No polyuria, nocturia or thyroid problems.  HEMATOLOGY: No anemia or easy bruising.  SKIN: No acne, rash or lesion.  MUSCULOSKELETAL: Positive for arthritis, no gout or swelling.  NEUROLOGIC: No CVA, TIA, ataxia, or dementia.  PSYCHIATRIC: No anxiety or depression.    All other systems reviewed and negative.   PHYSICAL EXAMINATION:  GENERAL: The patient is awake, alert, oriented x 3, not in acute distress.  VITAL SIGNS: Afebrile, pulse is 117. Blood pressure is 92/64, saturations 100% on 3 liters nasal cannula.  HEENT: Atraumatic, normocephalic. Pupils are equal, round and reactive to light and accommodation. EOM intact. Oral mucosa is moist.  NECK: Supple. No JVD. No carotid bruit.  LUNGS: Clear to auscultation bilaterally. Decreased breath sounds at the bases. No rales, rhonchi, respiratory distress or labored breathing.  HEART: Both the heart sounds are normal. Tachycardia present. No murmur heard. PMI not lateralized. Chest nontender.   EXTREMITIES: Good pedal pulses, good femoral pulses. No lower extremity edema. The patient does have a cast present in his right upper extremity due to his fracture.  ABDOMEN: Soft,  benign, nontender. No organomegaly. Positive bowel sounds.  NEUROLOGIC: Grossly intact cranial nerves 2 through 12.  No motor or sensory deficit.  PSYCHIATRIC: The patient is awake, alert, oriented x3.  SKIN: Warm and dry.    LABORATORY DATA:,: Platelet count is 131.   ASSESSMENT AND PLAN:  [name], a 54 year old with  history of alcohol-induced cardiomyopathy along with history of chronic obstructive pulmonary disease on home oxygen was seen in preop and being admitted secondary to:  1. Hypotension appears idiopathic. The patient is asymptomatic. He has stopped his blood pressure medications, last 2 doses have not been taken. The patient is asymptomatic. He received a bolus; we will see how his blood pressure does.  Have given him maintenance IV fluids for now, avoid aggressive IV fluids due to history of cardiomyopathy and congestive heart failure. We will hold off on blood pressure meds at present. Dr. Ernest Pine canceled the surgery. Appreciate his input.  2. Right radial distal fracture surgery is on hold due to blood pressure being low this morning. We will monitor and hopefully once it is stabilizes, the patient will be operated over the weekend or early next week.  3. Chronic  obstructive pulmonary disease with ongoing home oxygen use.  The patient does not appear to be in respiratory distress.  He has his usual shortness of breath on exertion. His saturations are 100% on 3 liters.  We will continue home oxygen and inhalers including DuoNebs.   4. New onset cirrhosis of liver, again appears to be stable.  5. Chronic kidney disease, stage III.   His labs are stable. We will continue to monitor creatinine and sodium.  Labs have been ordered for tomorrow morning.  6. Chronic contraction alkalosis, on Diamox.  7. Chronic hyponatremia which likely has been attributed to syndrome of inappropriate antidiuretic hormone secretion and or beer potomania, which is chronic.  His sodium is anywhere from 120 to 128.  The patient is maintaining well.  We will hold off on any aggressive work-up on the same since work-up has been done in the past.  8. History of atrial fibrillation with rapid ventricular response.  Heart rate improved with Coreg. No anticoagulation given recent fall and chronic, alcoholism, we will hold off on it right  now. The patient's ejection fraction is around 35%.  9. Deep vein thrombosis prophylaxis. Subcutaneous heparin t.i.d.  CODE STATUS:  The patient is a FULL CODE.   TIME SPENT: 50 minutes.     ____________________________ Wylie Hail Allena Katz, MD sap:DT D: 11/17/2013 15:26:00 ET T: 11/17/2013 17:17:44 ET JOB#: [id number]  cc: Tristin Gladman A. Allena Katz, MD, <Dictator> Willow Ora MD ELECTRONICALLY SIGNED 11/27/2013 15:55

## 2014-06-03 NOTE — Consult Note (Signed)
PATIENT NAME:  [name], [name] MR#:  [id number] DATE OF BIRTH:  [birth year]  DATE OF CONSULTATION:  01/29/2011  REFERRING PHYSICIAN:   CONSULTING PHYSICIAN:  Scot Junobert T. Siarra Gilkerson, MD  HISTORY: The patient is a 54 year old white male who was admitted to the hospital. He was found to have severe anemia. Hemoglobin in the mid 5 range whereas in July of this year his hemoglobin was in the 15 range. He gives a history of having loose dark stools for a couple of weeks, also of taking at least two ibuprofen a day for musculoskeletal pain.   REVIEW OF SYSTEMS: He denies any hematemesis. He denies any fever. No true skin rash. Lately, he has had some dysphagia. No heartburn. He vomited clear mucus yesterday. No blood in his vomitus. No vomiting here in the hospital. He also complains of severe muscle and joint pains. The patient has a history of severe congestive heart failure and his previous admission in July he was found to have global hypokinesis with ejection fraction 45%.    The patient says that he has had hypertension for many years in the 1990s and that he has not been treated up until recently. He got a prescription in the 1990s, lost his job, lost his insurance coverage, did not stay on medication, did not follow up with anyone about his untreated hypertension.   The patient has never had a colonoscopy or upper endoscopy. Previously in July his renal function was normal although his BNP was 19,000.   FAMILY HISTORY: Mother with liver cancer. She was an alcoholic. Father with lung cancer. Grandfather had two heart attacks.   HABITS: The patient drinks a 12 pack of beer a day or more. Smokes 3/4 pack of cigarettes a day. He has been admitted at least a couple of times in the hospital for alcohol a rehab.   PAST MEDICAL/SURGICAL HISTORY:  1. History of disk surgery in his neck. 2. History of major depression. 3. History of hypertension. 4. History of alcohol abuse. 5. History of congestive heart  failure.  6. History of alcohol and cigarette abuse.   PHYSICAL EXAMINATION:  GENERAL: White male somewhat obese in no acute distress.   VITAL SIGNS: Temperature 98.5, pulse 84, blood pressure 106/59, pulse oximetry 100% on 2 liters   HEENT: Sclerae anicteric. Conjunctivae pink. Tongue is pink. Head is atraumatic. Trachea is in the midline.   CHEST: Clear anterolateral fields.   HEART: No murmurs or gallops I can hear.   ABDOMEN: Somewhat obese. Minimal tenderness in the epigastric area, right upper quadrant, left lower quadrant, suprapubic area.   EXTREMITIES: No edema.   SKIN: Warm and dry.   PSYCHIATRIC: Mood and affect are appropriate.   RECTAL: Rectal exam was done and there was light tan liquidy stool that was heme negative.   LABORATORY, RADIOLOGICAL AND DIAGNOSTIC DATA: CPK and CPK MB and troponins negative. Glucose 109, BUN 112, creatinine 4, sodium 129, potassium 5.9, chloride 100, CO2 12, calcium 8.6, total bilirubin 0.6, alkaline phosphatase 181, ALT 29, AST 34, total protein 6.4, albumin 3, white count 8,400, hemoglobin 5.8, repeat is 6.6, platelet count 274. MCV 101. Troponins not elevated. BNP 1,270. Chest x-ray consistent with congestive heart failure.   ASSESSMENT/RECOMMENDATIONS:  1. Renal failure. The patient has been on high doses of diuretics as well as ACE inhibitors and has acute renal failure with elevated creatinine, BUN and low bicarbonate. He is being rehydrated and also transfused.  2. Anemia. He may have had bleeding in  the last few weeks with his history of loose black stools and history of Advil use. However, at the moment he did not appear to be bleeding, at least there is no blood in his rectal exam and there was no blood in his vomitus. He will eventually need an upper endoscopy as well as a colonoscopy after he recovers from the renal failure when his heart function is maximized.  3. He has a ferritin level that is elevated to the level of 1,469 and his  iron saturation is high and his serum iron is elevated at 244 with a high normal being 175. It is possible that he has undiagnosed hemochromatosis. I will order some genetic tests for this. It is possible that the iron saturation and ferritin are due to chronic alcohol insult to the liver.  4. Congestive heart failure. This is probably from long-standing untreated hypertension. However, hemochromatosis can also be associated with heart failure.  5. Low bicarbonate, very likely due to renal failure. He is getting fluids with bicarbonate.  6. His skin shows no signs of spider angiomas. His platelet count is normal and his albumin was 3 despite long history of alcoholism.   I will follow with you.  ____________________________ Scot Jun, MD rte:ap D: 01/29/2011 17:08:08 ET T: 01/29/2011 17:28:57 ET JOB#: [id number]  cc: Scot Jun, MD, <Dictator> Vipul S. Sherryll Burger, MD Antonieta Iba, MD Scot Jun MD ELECTRONICALLY SIGNED 02/19/2011 12:07

## 2014-06-03 NOTE — Discharge Summary (Signed)
PATIENT NAME:  [name], [name] MR#:  [id number] DATE OF BIRTH:  04-Dec-1960  DATE OF ADMISSION:  01/28/2011 DATE OF DISCHARGE:  02/01/2011  PRIMARY CARE PHYSICIAN: [facility]   FINAL DIAGNOSES:  1. Severe anemia.  2. B12 deficiency.  3. Hypovolemic shock.  4. Acute renal failure.  5. Alcohol hepatitis.  6. Tobacco abuse.  7. Sleep apnea, suspected.  8. Chronic respiratory failure/COPD on oxygen.  9. History of congestive heart failure but was actually dehydrated on this admission.   MEDICATIONS ON DISCHARGE:  1. Combivent 2 puffs every six hours as needed.  2. Lasix 20 mg p.o. daily.  3. Coreg 12.5 mg twice a day. 4. Lisinopril 5 mg p.o. daily.  5. Omeprazole 20 mg p.o. twice a day. 6. Vitamin B12 1000 mcg p.o. daily. 7. Triamcinolone cream 1% apply twice a day until rash is gone on the arm.  8. Nicotine patch 21 mg to chest wall daily.  9. Qvar 40 mcg 2 puffs twice a day.   NOTE:  1. Do not take alcohol.  2. Do not smoke cigarettes.   DIET: Low sodium diet.   ACTIVITY: Activity as tolerated.   FOLLOW-UP:  1. Follow-up with gastroenterologist, Dr. Bluford Kaufmann, in two weeks. 2. Follow[facility]up with [facility] in one week. Recommend checking a BMP and CBC at follow-up appointment.  REASON FOR ADMISSION: The patient was admitted 01/28/2011 and discharged 02/01/2011. The patient came in with shortness of breath.   HISTORY OF PRESENT ILLNESS: The patient is a 54 year old man with hypertension and alcohol abuse who came in with shortness of breath, chest pain, muscle and joint pain worsening over the last few days. He was admitted with severe anemia, suspected heart failure, acute renal failure, and hyponatremia.   LABORATORY AND RADIOLOGICAL DATA DURING THE HOSPITAL COURSE: EKG showed normal sinus rhythm, 79 beats per minute. Chest x-ray mild enlargement of the cardiac silhouette. No overt evidence of pulmonary edema or pneumonia. Folic acid 8.9. B12 185. Ferritin 1469. TIBC 237. Iron  serum 244. TSH 3.81. BNP 1273. White blood cell count 8.4, hemoglobin and hematocrit 5.8 and 17.2, platelet count 274, glucose 109, BUN 112, creatinine 4.08, sodium 129, potassium 5.9, chloride 100, CO2 20, calcium 8.6, alkaline phosphatase 181. Other liver function tests normal. Albumin low at 3. Urinalysis negative. Helicobacter antibodies positive. Troponin negative. Hemoglobin on the 20th came up to 6.6, creatinine down to 3.13. Ultrasound of the kidneys bilaterally showed no hydronephrosis. Hemoglobin on the 21st 8.2, creatinine down to 1.8. Hemoglobin on the 23rd 7.8, creatinine 1.15.   CONSULTANTS:  1. Dr. Austin Miles from Nephrology  2. Dr. Mechele Collin of Gastroenterology and Dr. Bluford Kaufmann  3. Dr. Mariah Milling, Cardiology    HOSPITAL COURSE PER PROBLEM LIST:  1. For the patient's severe anemia, the patient required 4 units of packed red blood cells throughout the hospital stay. The patient did have an endoscopy by Dr. Bluford Kaufmann done on December 22nd which showed a normal esophagus, normal stomach, and normal duodenum. Iron studies actually looks like anemia of chronic disease with an increased ferritin and also B12 deficiency. B12 supplementation IM here in the hospital and orally to be given upon discharge. The patient will follow-up with Dr. Bluford Kaufmann as outpatient for consideration of colonoscopy. If that is negative, can consider to send to Hematology as outpatient for further work-up. The patient's hemoglobin upon discharge is 7.8. He wanted a unit of blood prior to going home. I recommend checking a CBC as outpatient.  2. B12 deficiency. Vitamin  B12 injection given here. Oral supplementation upon discharge. Can consider IM shots as outpatient if continues to be anemic.  3. Hypovolemic shock. The patient was admitted to the ICU secondary to the severe anemia. Was started on diuresis which actually worsened the patient's blood pressure and he required pressors. Lasix was stopped and he was given IV fluid hydration and more blood  which resolved his hypovolemic shock. This had resolved. Medications, low dose, can be started as outpatient. Low dose lisinopril and Coreg and low dose Lasix with close monitoring of blood counts and kidney function status as outpatient.  4. Acute renal failure secondary to dehydration improved with IV fluid hydration.  5. Alcoholic hepatitis. The patient was put on CIWA protocol. No signs of tremor upon discharge. Advised not to drink anymore alcohol.  6. Tobacco abuse. Smoking cessation counseling done, three minutes. Nicotine patch applied.  7. Sleep apnea, suspected. Recommend outpatient sleep study.  8. Chronic respiratory failure, chronic obstructive pulmonary disease, on oxygen. Refilled his inhalers, also Qvar and albuterol. Advised not to smoke.  9. History of congestive heart failure. The patient required lots of IV fluids here. Lungs were clear. Low dose Lasix, Coreg, and lisinopril restarted since kidney function improved and blood pressure improved. Close clinical follow-up as outpatient.   TIME SPENT ON DISCHARGE: 40 minutes.   ____________________________ Herschell Dimesichard J. Renae GlossWieting, MD rjw:drc D: 02/01/2011 15:36:28 ET T: 02/02/2011 13:39:10 ET JOB#: [id number]  cc: Herschell Dimesichard J. Renae GlossWieting, MD, <Dictator> [facility] FieldsPaul Y. Bluford Kaufmannh, MD Salley ScarletICHARD J Jaylyne Breese MD ELECTRONICALLY SIGNED 02/25/2011 15:59

## 2016-01-20 IMAGING — CR DG CHEST 1V PORT
1 series · 1 of 1 positions shown · non-contrast
Comparison: 11/09/2013 at [DATE] p.m.

CLINICAL DATA: PICC line placement.

EXAM:
PORTABLE CHEST - 1 VIEW

[ap]
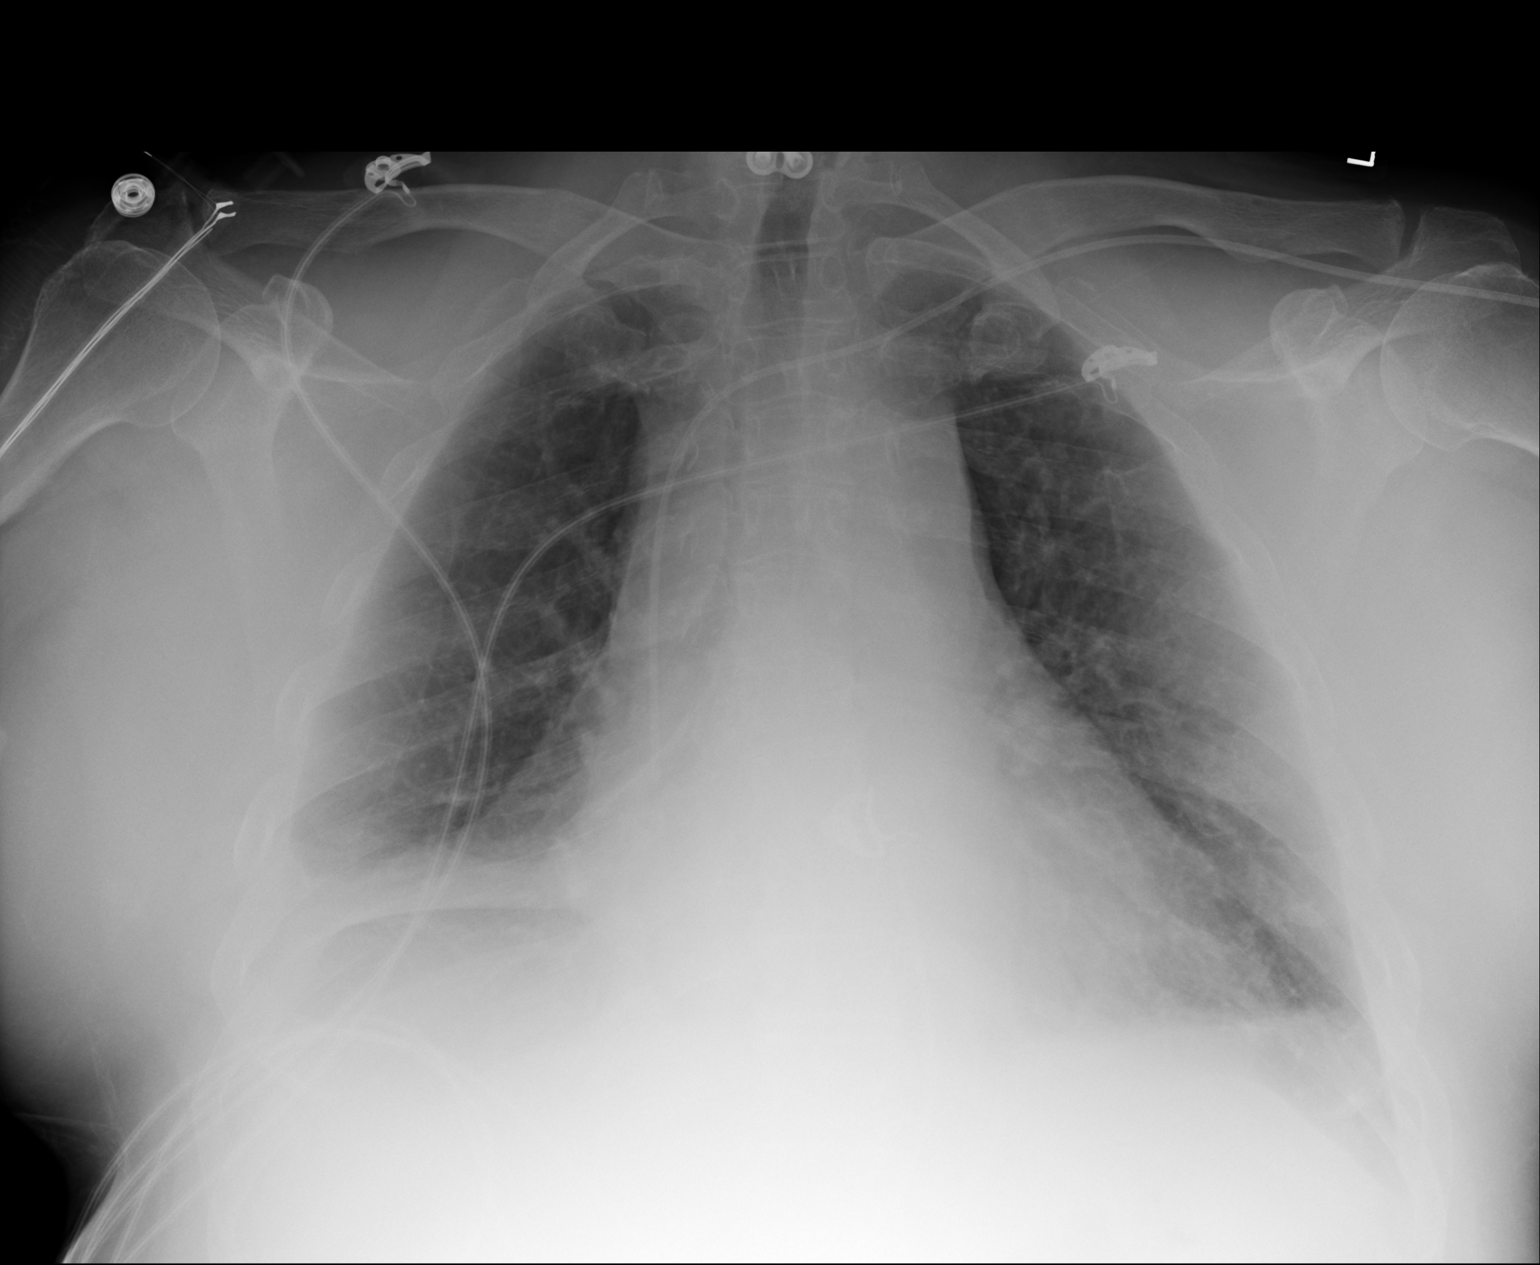

[1 of 1 positions shown; findings below may reference images not displayed]

FINDINGS: The left PICC line has been retracted and its tip is now at the
cavoatrial junction.

Stably enlarged cardiopericardial silhouette. Stable obscuration the
right hemidiaphragm with mild hemidiaphragmatic elevation. Lower
cervical plate and screw fixator.
IMPRESSION: 1. The PICC line has been retracted and its tip is now at the
cavoatrial junction. Otherwise stable.

## 2016-09-17 ENCOUNTER — Other Ambulatory Visit: Payer: Self-pay | Admitting: Orthopedic Surgery

## 2016-09-17 DIAGNOSIS — M25551 Pain in right hip: Secondary | ICD-10-CM

## 2016-09-25 ENCOUNTER — Ambulatory Visit
Admission: RE | Admit: 2016-09-25 | Discharge: 2016-09-25 | Disposition: A | Discharge status: Home / Self Care | Payer: Medicare Other | Source: Ambulatory Visit | Attending: Orthopedic Surgery | Admitting: Orthopedic Surgery

## 2016-09-25 DIAGNOSIS — M25551 Pain in right hip: Secondary | ICD-10-CM

## 2016-09-25 MED ORDER — METHYLPREDNISOLONE ACETATE 40 MG/ML INJ SUSP (RADIOLOG
120.0000 mg | Freq: Once | INTRAMUSCULAR | Status: AC
  Administered 2016-09-25: 120 mg via INTRA_ARTICULAR

## 2016-09-25 MED ORDER — IOPAMIDOL (ISOVUE-M 200) INJECTION 41%
1.0000 mL | Freq: Once | INTRAMUSCULAR | Status: AC
  Administered 2016-09-25: 1 mL via INTRA_ARTICULAR

## 2017-03-02 ENCOUNTER — Other Ambulatory Visit: Payer: Self-pay | Admitting: Physical Medicine and Rehabilitation

## 2017-03-02 DIAGNOSIS — M5416 Radiculopathy, lumbar region: Secondary | ICD-10-CM

## 2017-04-07 ENCOUNTER — Ambulatory Visit: Payer: Medicare Other | Admitting: Physical Therapy

## 2017-04-13 ENCOUNTER — Ambulatory Visit: Payer: Medicare Other | Admitting: Physical Therapy

## 2017-04-14 ENCOUNTER — Ambulatory Visit: Payer: Medicare Other | Attending: Physical Medicine and Rehabilitation

## 2017-04-14 ENCOUNTER — Other Ambulatory Visit: Payer: Self-pay

## 2017-04-14 DIAGNOSIS — M6281 Muscle weakness (generalized): Secondary | ICD-10-CM

## 2017-04-14 DIAGNOSIS — M25551 Pain in right hip: Secondary | ICD-10-CM

## 2017-04-14 DIAGNOSIS — M5416 Radiculopathy, lumbar region: Secondary | ICD-10-CM

## 2017-04-14 DIAGNOSIS — R2689 Other abnormalities of gait and mobility: Secondary | ICD-10-CM | POA: Diagnosis present

## 2017-04-14 NOTE — Therapy (Signed)
[facility] MAIN [facility] SERVICES [address] [place], [place], [address] Phone: [phone]   Fax:  [phone]  Physical Therapy Evaluation  Patient Details  Name: [name] MRN: [id number] Date of Birth: 01-Feb-1961 Referring Provider: Aniceto Boss.    Encounter Date: 04/14/2017  PT End of Session - 04/14/17 1818    Visit Number  1    Number of Visits  16    Date for PT Re-Evaluation  06/09/17    PT Start Time  1652    PT Stop Time  1749    PT Time Calculation (min)  57 min    Equipment Utilized During Treatment  Gait belt;Oxygen nasal cannula 2 L     Activity Tolerance  Patient limited by fatigue;Patient limited by pain    Behavior During Therapy  [facility] for tasks assessed/performed       Past Medical History:  Diagnosis Date  . Alcohol abuse   . Hypertension   . Tobacco abuse     Past Surgical History:  Procedure Laterality Date  . NECK SURGERY     For degenerative disk disease    There were no vitals filed for this visit.   Subjective Assessment - 04/14/17 1703    Subjective  Patient is a pleasant 57 year old male who presents with R hip pain and radiating syptoms down R leg.     Pertinent History  Patient is a 57 year old male who presents with a 6 year history of right hip pain. Pain began after a fall. Xrays were negative for a fracture. The pain has been severe and constant over the past 3 years. 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 An Intra articular steroid injection on 09/25/16 helped for 4-5 days. Patient also has intermittent right lower extremity pain in posterior aspect of right thigh and on lateral leg that began in January of this year.  Hip pain is aggravated by walking and alleviated by rest.  Pain fell 2 1/2 weeks ago and has been having lower back pain. Patient presents with nasal cannula at 2L - 2.5 liters.     Limitations  Standing;Walking;House hold activities;Other (comment)    How long can you  sit comfortably?  n/a    How long can you stand comfortably?  1 minutes    How long can you walk comfortably?  1 minutes    Patient Stated Goals  improve function, decrease pain to allow to return to daily activities     Currently in Pain?  Yes    Pain Score  6     Pain Location  Hip    Pain Orientation  Right    Pain Descriptors / Indicators  Shooting;Constant;Pounding    Pain Type  Chronic pain    Pain Radiating Towards  lateral knee     Pain Onset  More than a month ago    Pain Frequency  Constant    Aggravating Factors   standing walking    Pain Relieving Factors  sitting down    Effect of Pain on Daily Activities  limits mobility          [facility] PT Assessment - 04/14/17 0001      Assessment   Referring Provider  Aniceto Boss.     Onset Date/Surgical Date  04/15/11    Hand Dominance  Right    Prior Therapy  no       Precautions   Precautions  None  Restrictions   Weight Bearing Restrictions  No      Balance Screen   Has the patient fallen in the past 6 months  Yes    How many times?  1    Has the patient had a decrease in activity level because of a fear of falling?   Yes    Is the patient reluctant to leave their home because of a fear of falling?   Yes      Home Environment   Living Environment  Private residence    Living Arrangements  Spouse/significant other    Available Help at Discharge  Family    Type of Home  House    Home Access  Stairs to enter    Entrance Stairs-Number of Steps  3    Entrance Stairs-Rails  Right;Left;Can reach both    Home Layout  One level      Prior Function   Level of Independence  Independent with basic ADLs    Vocation  On disability    Leisure  watch tv      Cognition   Overall Cognitive Status  Within Functional Limits for tasks assessed    Behaviors  Restless      Observation/Other Assessments   Observations  patient twitches in all extremities, twitching increases with fatigue and when patient is nervous       Sensation   Light Touch  Appears Intact      Coordination   Gross Motor Movements are Fluid and Coordinated  No    Coordination and Movement Description  limited by body habitus and pain      Posture/Postural Control   Posture/Postural Control  Postural limitations    Postural Limitations  Rounded Shoulders;Forward head;Flexed trunk;Weight shift left      ROM / Strength   AROM / PROM / Strength  PROM;Strength      PROM   Overall PROM   Deficits;Due to pain    PROM Assessment Site  Hip;Lumbar    Right/Left Hip  Right R hip flexion: 5, abduction 14, adduction 4     Left Hip Flexion  51    Left Hip ABduction  30    Left Hip ADduction  14    Lumbar Flexion  20    Lumbar Extension  5    Lumbar - Right Side Bend  limited pain    Lumbar - Left Side Bend  limited    Lumbar - Right Rotation  limited pain      Strength   Overall Strength  Deficits;Due to pain    Strength Assessment Site  Hip;Knee;Ankle    Right/Left Hip  Right;Left    Right Hip Flexion  2-/5    Right Hip Extension  2-/5    Right Hip External Rotation   2-/5    Right Hip ABduction  2+/5    Right Hip ADduction  2+/5    Left Hip Flexion  4-/5    Left Hip Extension  4-/5    Left Hip External Rotation  4-/5    Left Hip Internal Rotation  4-/5    Left Hip ABduction  4-/5    Left Hip ADduction  4-/5    Right/Left Knee  Right;Left    Right Knee Flexion  3/5    Right Knee Extension  3/5    Left Knee Flexion  4-/5    Left Knee Extension  4-/5    Right/Left Ankle  Right;Left    Right Ankle Dorsiflexion  3+/5    Right Ankle Plantar Flexion  3+/5    Left Ankle Dorsiflexion  4/5    Left Ankle Plantar Flexion  4/5      Right Hip   Right Hip Flexion  5    Right Hip ABduction  14    Right Hip ADduction  4      Flexibility   Soft Tissue Assessment /Muscle Length  yes    Hamstrings  limited hamstring mobility    Quadriceps  limited iliopsoas tissue extensibility       Palpation   Spinal mobility  Hypomobile R  lumbar UPA grade II performed in seated postion exacerbated radicular symptoms grade II L UPA and CPA in seated position nonpainful hypomob    Palpation comment  AP R hip painful with hard end feel       Special Tests    Special Tests  Hip Special Tests    Hip Special Tests   Luisa Hart (FABER) Test;Hip Scouring;Ober's Test;Anterior Hip Impingement Test      Luisa Hart [facility]) Test   Findings  Unable to test    Side  Right    Comments  pain      Ober's Test   Findings  Unable to test    Side  Right    Comments  pain      Hip Scouring   Findings  Unable to test    Side  Right    Comments  pain      Anterior Hip Impingement Test    Findings  Unable to test    Side   Right    Comments  pain      Bed Mobility   Bed Mobility  Rolling Right;Rolling Left;Supine to Sit;Sit to Supine    Rolling Right  5: Set up    Rolling Left  5: Set up    Supine to Sit  4: Min assist    Supine to Sit Details (indicate cue type and reason)  min A to UE to allow  patient to pull to sit     Sit to Supine  5: Supervision      Transfers   Transfers  Sit to Stand;Stand to Sit    Sit to Stand  4: Min guard    Five time sit to stand comments   slow with increase pain    Stand to Sit  4: Min guard      Ambulation/Gait   Ambulation/Gait  Yes    Ambulation/Gait Assistance  4: Min guard    Ambulation Distance (Feet)  30 Feet    Assistive device  None    Gait Pattern  Step-through pattern;Decreased stance time - right;Decreased stride length;Decreased hip/knee flexion - right;Decreased weight shift to right;Antalgic;Wide base of support    Ambulation Surface  Level;Indoor    Gait velocity  .675m/s            PAIN: Worst pain: 8/10 hip pain Best pain: 4/10 Current pain: 6/10    PROM/AROM: Trunk Flexion 20   Trunk Extension 5  Trunk R SB limited  Trunk L SB limited  Trunk R rotation limited  Trunk L rotation limited    OUTCOME MEASURES: TEST Outcome Interpretation  5 times sit<>stand 27  sec >60 yo, >15 sec indicates increased risk for falls  10 meter walk test         16 seconds   =.625     m/s <1.0 m/s indicates increased risk for falls; limited community  ambulator  LEFS 15/80 High perceived disability   ABC  30.1% Low level of physical functioning                  Objective measurements completed on examination: See above findings.              PT Education - 04/14/17 1817    Education provided  Yes    Education Details  POC, strength, guarding, use of heat and ice.     Person(s) Educated  Patient    Methods  Explanation;Tactile cues;Verbal cues    Comprehension  Verbalized understanding;Returned demonstration;Verbal cues required       PT Short Term Goals - 04/14/17 1823      PT SHORT TERM GOAL #1   Title  Patient will be independent in home exercise program to improve strength/mobility for better functional independence with ADLs.    Time  2    Period  Weeks    Status  New    Target Date  04/28/17      PT SHORT TERM GOAL #2   Title   Patient (< 77 years old) will complete five times sit to stand test in < 20 seconds indicating an increased LE strength and improved balance.    Baseline  3/6: 27 seconds    Time  2    Period  Weeks    Status  New    Target Date  04/28/17      PT SHORT TERM GOAL #3   Title  Patient will report a worst pain of 5/10 on VAS in R hip to improve tolerance with ADLs and reduced symptoms with activities.     Baseline  3/6: 8/10    Time  2    Period  Weeks    Status  New    Target Date  04/28/17        PT Long Term Goals - 04/14/17 1825      PT LONG TERM GOAL #1   Title  Patient will report a worst pain of 3/10 on VAS in R hip to improve tolerance with ADLs and reduced symptoms with activities.     Baseline  3/6: 8/10 pain    Time  8    Period  Weeks    Status  New    Target Date  06/09/17      PT LONG TERM GOAL #2   Title  Patient will increase BLE gross strength to 4+/5 as to improve functional  strength for independent gait, increased standing tolerance and increased ADL ability.    Baseline  3/6: RLE 2+/5    Time  8    Period  Weeks    Status  New    Target Date  06/09/17      PT LONG TERM GOAL #3   Title  Patient will increase 10 meter walk test to >1.64m/s as to improve gait speed for better community ambulation and to reduce fall risk.    Baseline  3/6: .625 m/s    Time  8    Period  Weeks    Status  New    Target Date  06/09/17      PT LONG TERM GOAL #4   Title  Patient (< 29 years old) will complete five times sit to stand test in < 10 seconds indicating an increased LE strength and improved balance.    Baseline  3/6: 27 seconds     Time  8  Period  Weeks    Status  New    Target Date  06/09/17      PT LONG TERM GOAL #5   Title  Patient will increase lower extremity functional scale to >60/80 to demonstrate improved functional mobility and increased tolerance with ADLs.     Baseline  3/6: 15/80    Time  8    Period  Weeks    Status  New    Target Date  06/09/17             Plan - 04/14/17 1819    Clinical Impression Statement  Patient is a pleasant 57 year old male who presents with R hip pain and radicular symptoms. Patient is unable to assume prone position or actively /passively move R hip >10 degrees in any direction limiting assessment of radicular pain at this time. Excessive muscle guarding present in R hip and lumbosacral paraspinal musculature. Twitching of all extremities present at rest and with fatigue. Patient presents with weakness of BLE with R>L. 5x STS=27 seconds, 10 MWT-.637m/s LEFS= 15/80, ABC=30/1%. Patient fatigued excessively by end of evaluation limiting duration. Patient will benefit from skilled physical therapy to increase LE strength, decrease R hip pain, and improve mobility for greater quality of life.     History and Personal Factors relevant to plan of care:  This patient presents with 4 personal factors/ comorbidities  and, 4   body elements including body structures and functions, activity limitations and or participation restrictions. Patient's condition is unstable    Clinical Presentation  Unstable    Clinical Presentation due to:  Radicular pain intermittent, cardiovascular complications     Clinical Decision Making  High    Rehab Potential  Fair    Clinical Impairments Affecting Rehab Potential  (+) compliance, age, (-) chronicity, weight, cardiovascular comorbidities    PT Frequency  2x / week    PT Duration  8 weeks    PT Treatment/Interventions  ADLs/Self Care Home Management;Cryotherapy;Electrical Stimulation;Moist Heat;Traction;Ultrasound;Stair training;Gait training;DME Instruction;Functional mobility training;Therapeutic activities;Therapeutic exercise;Balance training;Patient/family education;Neuromuscular re-education;Manual techniques;Energy conservation;Passive range of motion;Taping    PT Next Visit Plan  per doctor order:  PT strengthening, stretching, ROM, modalities: ice heat, gait/balance training    PT Home Exercise Plan  prescribe next session    Consulted and Agree with Plan of Care  Patient       Patient will benefit from skilled therapeutic intervention in order to improve the following deficits and impairments:  Abnormal gait, Cardiopulmonary status limiting activity, Decreased activity tolerance, Decreased balance, Decreased coordination, Decreased knowledge of use of DME, Decreased endurance, Decreased mobility, Decreased range of motion, Decreased strength, Difficulty walking, Hypomobility, Impaired perceived functional ability, Impaired flexibility, Improper body mechanics, Postural dysfunction, Pain  Visit Diagnosis: Pain in right hip  Other abnormalities of gait and mobility  Muscle weakness (generalized)  Radiculopathy, lumbar region     Problem List Patient Active Problem List   Diagnosis Date Noted  . Jaundice 04/20/2011  . Smoking 12/10/2010  . CHF (congestive heart  failure) (HCC) 09/09/2010  . Cardiomyopathy 09/09/2010  . HTN (hypertension) 09/09/2010  . ETOH abuse 09/09/2010   Precious Bard, PT, DPT   Precious Bard 04/14/2017, 6:30 PM  [facility] MAIN [facility] SERVICES [address] [place], [place], [address] Phone: [phone]   Fax:  [phone]  Name: [name] MRN: [id number] Date of Birth: 1960/07/16

## 2017-04-15 ENCOUNTER — Ambulatory Visit: Payer: Medicare Other | Admitting: Physical Therapy

## 2017-04-20 ENCOUNTER — Ambulatory Visit: Payer: Medicare Other | Admitting: Physical Therapy

## 2017-04-20 ENCOUNTER — Encounter: Payer: Self-pay | Admitting: Physical Therapy

## 2017-04-20 DIAGNOSIS — M25551 Pain in right hip: Secondary | ICD-10-CM

## 2017-04-20 DIAGNOSIS — R2689 Other abnormalities of gait and mobility: Secondary | ICD-10-CM

## 2017-04-20 DIAGNOSIS — M6281 Muscle weakness (generalized): Secondary | ICD-10-CM

## 2017-04-20 DIAGNOSIS — M5416 Radiculopathy, lumbar region: Secondary | ICD-10-CM

## 2017-04-20 NOTE — Therapy (Signed)
[facility] MAIN [facility] SERVICES [address] [place], [place], [address] Phone: [phone]   Fax:  [phone]  Physical Therapy Treatment  Patient Details  Name: [name] MRN: [id number] Date of Birth: Sep 30, 1960 Referring Provider: Aniceto Boss.    Encounter Date: 04/20/2017  PT End of Session - 04/20/17 1532    Visit Number  2    Number of Visits  16    Date for PT Re-Evaluation  06/09/17    PT Start Time  1530    PT Stop Time  1614    PT Time Calculation (min)  44 min    Equipment Utilized During Treatment  Gait belt;Oxygen nasal cannula 2 L     Activity Tolerance  Patient limited by fatigue;Patient limited by pain    Behavior During Therapy  [facility] for tasks assessed/performed       Past Medical History:  Diagnosis Date  . Alcohol abuse   . Hypertension   . Tobacco abuse     Past Surgical History:  Procedure Laterality Date  . NECK SURGERY     For degenerative disk disease    There were no vitals filed for this visit.  Subjective Assessment - 04/20/17 1536    Subjective  Pt reports 5/10 R hip pain.  He is still having back pain, but only when getting out of bed, "it takes my breath".      Pertinent History  Patient is a 57 year old male who presents with a 6 year history of right hip pain. Pain began after a fall. Xrays were negative for a fracture. The pain has been severe and constant over the past 3 years. 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 An Intra articular steroid injection on 09/25/16 helped for 4-5 days. Patient also has intermittent right lower extremity pain in posterior aspect of right thigh and on lateral leg that began in January of this year.  Hip pain is aggravated by walking and alleviated by rest.  Pain fell 2 1/2 weeks ago and has been having lower back pain. Patient presents with nasal cannula at 2L - 2.5 liters.     Limitations  Standing;Walking;House hold activities;Other (comment)    How  long can you sit comfortably?  n/a    How long can you stand comfortably?  1 minutes    How long can you walk comfortably?  1 minutes    Patient Stated Goals  improve function, decrease pain to allow to return to daily activities     Currently in Pain?  Yes    Pain Score  5     Pain Location  Hip    Pain Orientation  Right    Pain Descriptors / Indicators  Aching    Pain Type  Chronic pain    Pain Onset  More than a month ago       TREATMENT  SpO2 96% on 2L when supine on bolster.   Supine on bolster: Long axis distraction R hip with belt 4x45 second bouts.   Supine on bolster: R hip lateral joint mob grade III with belt 3x30 second bouts   Pt stood and took several steps following manual therapy and reports "that's much better" and rates his pain as 2/5 down from a 5/10 at arrival.   Demonstrated to pt and then the pt performed log rolling technique for supine>sit. Pt reports that this was much better than how he normally gets OOB. (added to HEP).  Demonstrated to pt how he would use log roll technique to get back to supine.   Seated Bil hip adduction isometric 10 second holds with green ball between knees x10 reps (added to HEP)   Sit<>Stand from elevated mat table x2, pt reported pain traveling down RLE when he stands. Cues provided to perform glute squeeze with the pain decreasing, repeated x7 reps. Discontinued after 9 reps total due to pain in R hip.   Attempted standing R hip Abd but this was too painful in the R hip for the pt.   Bil LAQ alternating. x10 each LE. Pt rates this exercise as challenging.                         PT Education - 04/20/17 1531    Education provided  Yes    Education Details  Exercise technique    Person(s) Educated  Patient    Methods  Explanation;Demonstration;Verbal cues;Handout    Comprehension  Verbalized understanding;Returned demonstration;Verbal cues required;Need further instruction       PT Short Term Goals -  04/14/17 1823      PT SHORT TERM GOAL #1   Title  Patient will be independent in home exercise program to improve strength/mobility for better functional independence with ADLs.    Time  2    Period  Weeks    Status  New    Target Date  04/28/17      PT SHORT TERM GOAL #2   Title   Patient (< 55 years old) will complete five times sit to stand test in < 20 seconds indicating an increased LE strength and improved balance.    Baseline  3/6: 27 seconds    Time  2    Period  Weeks    Status  New    Target Date  04/28/17      PT SHORT TERM GOAL #3   Title  Patient will report a worst pain of 5/10 on VAS in R hip to improve tolerance with ADLs and reduced symptoms with activities.     Baseline  3/6: 8/10    Time  2    Period  Weeks    Status  New    Target Date  04/28/17        PT Long Term Goals - 04/14/17 1825      PT LONG TERM GOAL #1   Title  Patient will report a worst pain of 3/10 on VAS in R hip to improve tolerance with ADLs and reduced symptoms with activities.     Baseline  3/6: 8/10 pain    Time  8    Period  Weeks    Status  New    Target Date  06/09/17      PT LONG TERM GOAL #2   Title  Patient will increase BLE gross strength to 4+/5 as to improve functional strength for independent gait, increased standing tolerance and increased ADL ability.    Baseline  3/6: RLE 2+/5    Time  8    Period  Weeks    Status  New    Target Date  06/09/17      PT LONG TERM GOAL #3   Title  Patient will increase 10 meter walk test to >1.1787m/s as to improve gait speed for better community ambulation and to reduce fall risk.    Baseline  3/6: .625 m/s    Time  8  Period  Weeks    Status  New    Target Date  06/09/17      PT LONG TERM GOAL #4   Title  Patient (< 63 years old) will complete five times sit to stand test in < 10 seconds indicating an increased LE strength and improved balance.    Baseline  3/6: 27 seconds     Time  8    Period  Weeks    Status  New     Target Date  06/09/17      PT LONG TERM GOAL #5   Title  Patient will increase lower extremity functional scale to >60/80 to demonstrate improved functional mobility and increased tolerance with ADLs.     Baseline  3/6: 15/80    Time  8    Period  Weeks    Status  New    Target Date  06/09/17            Plan - 04/20/17 1537    Clinical Impression Statement  Pt reports significant decrease in R hip pain following long axis distraction and R lateral hip joint mobs with pain decreasing from 5/10 to 2/10.  Pt able to complete isometric Bil hip strengthening exercises min pain>painfree this session.  Initiated pt's HEP and provided pt with handout this sesison.  Pt will benefit from continued skilled PT interventions for decreased pain and improved QOL.     Rehab Potential  Fair    Clinical Impairments Affecting Rehab Potential  (+) compliance, age, (-) chronicity, weight, cardiovascular comorbidities    PT Frequency  2x / week    PT Duration  8 weeks    PT Treatment/Interventions  ADLs/Self Care Home Management;Cryotherapy;Electrical Stimulation;Moist Heat;Traction;Ultrasound;Stair training;Gait training;DME Instruction;Functional mobility training;Therapeutic activities;Therapeutic exercise;Balance training;Patient/family education;Neuromuscular re-education;Manual techniques;Energy conservation;Passive range of motion;Taping    PT Next Visit Plan  per doctor order:  PT strengthening, stretching, ROM, modalities: ice heat, gait/balance training    PT Home Exercise Plan  Bil hip Abd/ER isometrics in sitting, Bil hip add isometrics in sitting, log roll technique    Consulted and Agree with Plan of Care  Patient       Patient will benefit from skilled therapeutic intervention in order to improve the following deficits and impairments:  Abnormal gait, Cardiopulmonary status limiting activity, Decreased activity tolerance, Decreased balance, Decreased coordination, Decreased knowledge of use of  DME, Decreased endurance, Decreased mobility, Decreased range of motion, Decreased strength, Difficulty walking, Hypomobility, Impaired perceived functional ability, Impaired flexibility, Improper body mechanics, Postural dysfunction, Pain  Visit Diagnosis: Pain in right hip  Other abnormalities of gait and mobility  Muscle weakness (generalized)  Radiculopathy, lumbar region     Problem List Patient Active Problem List   Diagnosis Date Noted  . Jaundice 04/20/2011  . Smoking 12/10/2010  . CHF (congestive heart failure) (HCC) 09/09/2010  . Cardiomyopathy 09/09/2010  . HTN (hypertension) 09/09/2010  . ETOH abuse 09/09/2010     Encarnacion Chu PT, DPT 04/20/2017, 4:17 PM  [facility] MAIN [facility] SERVICES [address] [place], [place], [address] Phone: [phone]   Fax:  [phone]  Name: [name] MRN: [id number] Date of Birth: 1960/02/28

## 2017-04-22 ENCOUNTER — Encounter: Payer: Medicare Other | Admitting: Physical Therapy

## 2017-04-23 ENCOUNTER — Encounter: Payer: Medicare Other | Admitting: Physical Therapy

## 2017-04-26 ENCOUNTER — Encounter: Payer: Medicare Other | Admitting: Physical Therapy

## 2017-04-28 ENCOUNTER — Encounter: Payer: Medicare Other | Admitting: Physical Therapy

## 2017-04-29 ENCOUNTER — Ambulatory Visit: Payer: Medicare Other | Admitting: Physical Therapy

## 2017-05-04 ENCOUNTER — Ambulatory Visit: Payer: Medicare Other | Admitting: Physical Therapy

## 2017-05-05 ENCOUNTER — Ambulatory Visit: Payer: Medicare Other

## 2017-05-05 VITALS — BP 147/86 | HR 80

## 2017-05-05 DIAGNOSIS — M6281 Muscle weakness (generalized): Secondary | ICD-10-CM

## 2017-05-05 DIAGNOSIS — M25551 Pain in right hip: Secondary | ICD-10-CM

## 2017-05-05 DIAGNOSIS — R2689 Other abnormalities of gait and mobility: Secondary | ICD-10-CM

## 2017-05-05 NOTE — Therapy (Signed)
[name], [place], [id number] Phone: [phone]   Fax:  [phone]  Physical Therapy Treatment  Patient Details  Name: [name] MRN: [id number] Date of Birth: May 28, 1960 Referring Provider: Aniceto BossBodea M.    Encounter Date: 05/05/2017  PT End of Session - 05/05/17 1658    Visit Number  3    Number of Visits  16    Date for PT Re-Evaluation  06/09/17    PT Start Time  1610    PT Stop Time  1645    PT Time Calculation (min)  35 min    Equipment Utilized During Treatment  Gait belt;Oxygen nasal cannula 2 L     Activity Tolerance  Patient limited by fatigue;Patient limited by Rowland    Behavior During Therapy  [facility] for tasks assessed/performed       Past Medical History:  Diagnosis Date  . Alcohol abuse   . Hypertension   . Tobacco abuse     Past Surgical History:  Procedure Laterality Date  . NECK SURGERY     For degenerative disk disease    Vitals:   05/05/17 1610  BP: (!) 147/86  Pulse: 80  SpO2: 98%    Subjective Assessment - 05/05/17 1611    Subjective  Pt reports 3/10 R hip Rowland at rest, 8/10 with standing. His back feels better but his R knee is hurting currently. He rates the knee Rowland as 4/10. He is performing HEP as prescribed by therapist. No specific questions currently.     Pertinent History  Patient is a 57 year old male who presents with a 6 year history of right hip Rowland. Rowland began after a fall. Xrays were negative for a fracture. The Rowland has been severe and constant over the past 3 years. 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 An Intra articular steroid injection on 09/25/16 helped for 4-5 days. Patient also has intermittent right lower extremity Rowland in posterior aspect of right thigh and on lateral leg that began in January of this year.  Hip Rowland is aggravated by walking and alleviated by rest.  Rowland fell 2 1/2 weeks ago and has been having lower back Rowland.  Patient presents with nasal cannula at 2L - 2.5 liters.     Currently in Rowland?  Yes    Rowland Score  3     Rowland Location  Hip    Rowland Orientation  Right    Rowland Descriptors / Indicators  Aching    Rowland Type  Chronic Rowland    Rowland Onset  More than a month ago    Multiple Rowland Sites  Yes    Rowland Score  4    Rowland Location  Knee    Rowland Orientation  Right    Rowland Descriptors / Indicators  Aching    Rowland Type  Chronic Rowland                   TREATMENT   Ther-ex  NuStep L0 x 5 minutes for warm-up during history, challenging for patient and he has to move through partial ROM due to R hip Rowland;  Supine with head elevated approximately 30 degrees:  Attempted PROM of R hip however painful in all directions and pt cannot tolerate; R quad sets 3s hold x 10; Glut sets 3s hold x 10; Isometric R hip abduction x 10; Isometric R hip adduction x 10; Attempted R SAQ  but pt unable to tolerate secondary to Rowland;  Manual Therapy  Supine with head elevated approximately 30 degrees:  Long axis distraction R hip with belt 3x30 second bouts.  R hip lateral joint mob grade III with belt 3x30 second bouts, pt reports increase in Rowland at end of manual therapy.               PT Education - 05/05/17 1633    Education provided  Yes    Education Details  exericse form/technique    Person(s) Educated  Patient    Methods  Explanation    Comprehension  Verbalized understanding       PT Short Term Goals - 04/14/17 1823      PT SHORT TERM GOAL #1   Title  Patient will be independent in home exercise program to improve strength/mobility for better functional independence with ADLs.    Time  2    Period  Weeks    Status  New    Target Date  04/28/17      PT SHORT TERM GOAL #2   Title   Patient (< 103 years old) will complete five times sit to stand test in < 20 seconds indicating an increased LE strength and improved balance.    Baseline  3/6: 27 seconds    Time  2    Period   Weeks    Status  New    Target Date  04/28/17      PT SHORT TERM GOAL #3   Title  Patient will report a worst Rowland of 5/10 on VAS in R hip to improve tolerance with ADLs and reduced symptoms with activities.     Baseline  3/6: 8/10    Time  2    Period  Weeks    Status  New    Target Date  04/28/17        PT Long Term Goals - 04/14/17 1825      PT LONG TERM GOAL #1   Title  Patient will report a worst Rowland of 3/10 on VAS in R hip to improve tolerance with ADLs and reduced symptoms with activities.     Baseline  3/6: 8/10 Rowland    Time  8    Period  Weeks    Status  New    Target Date  06/09/17      PT LONG TERM GOAL #2   Title  Patient will increase BLE gross strength to 4+/5 as to improve functional strength for independent gait, increased standing tolerance and increased ADL ability.    Baseline  3/6: RLE 2+/5    Time  8    Period  Weeks    Status  New    Target Date  06/09/17      PT LONG TERM GOAL #3   Title  Patient will increase 10 meter walk test to >1.62m/s as to improve gait speed for better community ambulation and to reduce fall risk.    Baseline  3/6: .625 m/s    Time  8    Period  Weeks    Status  New    Target Date  06/09/17      PT LONG TERM GOAL #4   Title  Patient (< 27 years old) will complete five times sit to stand test in < 10 seconds indicating an increased LE strength and improved balance.    Baseline  3/6: 27 seconds     Time  8  Period  Weeks    Status  New    Target Date  06/09/17      PT LONG TERM GOAL #5   Title  Patient will increase lower extremity functional scale to >60/80 to demonstrate improved functional mobility and increased tolerance with ADLs.     Baseline  3/6: 15/80    Time  8    Period  Weeks    Status  New    Target Date  06/09/17            Plan - 05/05/17 1658    Clinical Impression Statement  Pt with poor tolerance for therapy today due to significantly elevated R hip Rowland. He can only tolerate very low  level strengthening and minimal manual therapy so session is shorter than normal. Pt encouraged to continue HEP and follow-up as scheduled. If he does not demonstrate some progress with therapy over the next few weeks it would be reasonable to consider discharging him from therapy.     Rehab Potential  Fair    Clinical Impairments Affecting Rehab Potential  (+) compliance, age, (-) chronicity, weight, cardiovascular comorbidities    PT Frequency  2x / week    PT Duration  8 weeks    PT Treatment/Interventions  ADLs/Self Care Home Management;Cryotherapy;Electrical Stimulation;Moist Heat;Traction;Ultrasound;Stair training;Gait training;DME Instruction;Functional mobility training;Therapeutic activities;Therapeutic exercise;Balance training;Patient/family education;Neuromuscular re-education;Manual techniques;Energy conservation;Passive range of motion;Taping    PT Next Visit Plan  per doctor order:  PT strengthening, stretching, ROM, modalities: ice heat, gait/balance training    PT Home Exercise Plan  Bil hip Abd/ER isometrics in sitting, Bil hip add isometrics in sitting, log roll technique    Consulted and Agree with Plan of Care  Patient       Patient will benefit from skilled therapeutic intervention in order to improve the following deficits and impairments:  Abnormal gait, Cardiopulmonary status limiting activity, Decreased activity tolerance, Decreased balance, Decreased coordination, Decreased knowledge of use of DME, Decreased endurance, Decreased mobility, Decreased range of motion, Decreased strength, Difficulty walking, Hypomobility, Impaired perceived functional ability, Impaired flexibility, Improper body mechanics, Postural dysfunction, Rowland  Visit Diagnosis: Rowland in right hip  Other abnormalities of gait and mobility  Muscle weakness (generalized)     Problem List Patient Active Problem List   Diagnosis Date Noted  . Jaundice 04/20/2011  . Smoking 12/10/2010  . CHF  (congestive heart failure) (HCC) 09/09/2010  . Cardiomyopathy 09/09/2010  . HTN (hypertension) 09/09/2010  . ETOH abuse 09/09/2010   Lynnea Maizes PT, DPT   Huprich,Jason 05/05/2017, 5:03 PM  [place] [facility] MAIN [facility] [address] [place], [place], [address] Phone: [phone]   Fax:  [phone]  Name: [name] MRN: [id number] Date of Birth: July 18, 1960

## 2017-05-06 ENCOUNTER — Encounter: Payer: Medicare Other | Admitting: Physical Therapy

## 2017-06-09 DEATH — deceased
